# Patient Record
Sex: Male | Born: 1945 | Race: White | Hispanic: No | Marital: Married | Smoking: Never smoker
Health system: Southern US, Community
[De-identification: ages and names within clinical notes are randomized; demographics above are authoritative.]

## PROBLEM LIST (undated history)

## (undated) DIAGNOSIS — I10 Essential (primary) hypertension: Secondary | ICD-10-CM

## (undated) DIAGNOSIS — K219 Gastro-esophageal reflux disease without esophagitis: Secondary | ICD-10-CM

## (undated) DIAGNOSIS — E785 Hyperlipidemia, unspecified: Secondary | ICD-10-CM

## (undated) DIAGNOSIS — C4490 Unspecified malignant neoplasm of skin, unspecified: Secondary | ICD-10-CM

## (undated) DIAGNOSIS — E039 Hypothyroidism, unspecified: Secondary | ICD-10-CM

## (undated) DIAGNOSIS — I480 Paroxysmal atrial fibrillation: Secondary | ICD-10-CM

## (undated) DIAGNOSIS — Q2112 Patent foramen ovale: Secondary | ICD-10-CM

## (undated) DIAGNOSIS — N529 Male erectile dysfunction, unspecified: Secondary | ICD-10-CM

## (undated) DIAGNOSIS — I639 Cerebral infarction, unspecified: Secondary | ICD-10-CM

## (undated) HISTORY — DX: Patent foramen ovale: Q21.12

## (undated) HISTORY — DX: Hyperlipidemia, unspecified: E78.5

## (undated) HISTORY — PX: HERNIA REPAIR: SHX51

## (undated) HISTORY — DX: Male erectile dysfunction, unspecified: N52.9

## (undated) HISTORY — DX: Gastro-esophageal reflux disease without esophagitis: K21.9

## (undated) HISTORY — PX: VASECTOMY: SHX75

## (undated) HISTORY — DX: Hypothyroidism, unspecified: E03.9

## (undated) HISTORY — DX: Essential (primary) hypertension: I10

## (undated) HISTORY — DX: Paroxysmal atrial fibrillation: I48.0

## (undated) HISTORY — PX: POLYPECTOMY: SHX149

## (undated) HISTORY — DX: Cerebral infarction, unspecified: I63.9

## (undated) HISTORY — DX: Unspecified malignant neoplasm of skin, unspecified: C44.90

---

## 2004-07-02 ENCOUNTER — Ambulatory Visit: Payer: Self-pay | Admitting: Family Medicine

## 2005-03-25 ENCOUNTER — Ambulatory Visit: Payer: Self-pay | Admitting: Family Medicine

## 2005-06-26 ENCOUNTER — Ambulatory Visit: Payer: Self-pay | Admitting: Family Medicine

## 2005-07-03 ENCOUNTER — Ambulatory Visit: Payer: Self-pay | Admitting: Family Medicine

## 2005-07-16 ENCOUNTER — Ambulatory Visit: Payer: Self-pay | Admitting: Gastroenterology

## 2005-08-27 ENCOUNTER — Encounter (INDEPENDENT_AMBULATORY_CARE_PROVIDER_SITE_OTHER): Payer: Self-pay | Admitting: Specialist

## 2005-08-27 ENCOUNTER — Ambulatory Visit: Payer: Self-pay | Admitting: Gastroenterology

## 2005-09-27 ENCOUNTER — Ambulatory Visit: Payer: Self-pay | Admitting: Family Medicine

## 2006-02-11 LAB — HM COLONOSCOPY: HM Colonoscopy: NORMAL

## 2006-06-24 ENCOUNTER — Ambulatory Visit: Payer: Self-pay | Admitting: Family Medicine

## 2006-06-24 LAB — CONVERTED CEMR LAB
ALT: 24 units/L (ref 0–40)
AST: 21 units/L (ref 0–37)
Albumin: 4.1 g/dL (ref 3.5–5.2)
BUN: 9 mg/dL (ref 6–23)
Basophils Absolute: 0 10*3/uL (ref 0.0–0.1)
Bilirubin, Direct: 0.1 mg/dL (ref 0.0–0.3)
Calcium: 9.2 mg/dL (ref 8.4–10.5)
Chloride: 105 meq/L (ref 96–112)
Eosinophils Absolute: 0.2 10*3/uL (ref 0.0–0.6)
Eosinophils Relative: 4.2 % (ref 0.0–5.0)
GFR calc Af Amer: 111 mL/min
GFR calc non Af Amer: 91 mL/min
Glucose, Bld: 87 mg/dL (ref 70–99)
HDL: 43.4 mg/dL (ref >39.0)
Lymphocytes Relative: 26.4 % (ref 12.0–46.0)
MCHC: 34.7 g/dL (ref 30.0–36.0)
MCV: 86.2 fL (ref 78.0–100.0)
Monocytes Relative: 9.1 % (ref 3.0–11.0)
Neutro Abs: 2.7 10*3/uL (ref 1.4–7.7)
Platelets: 136 10*3/uL — ABNORMAL LOW (ref 150–400)
RBC: 5.43 M/uL (ref 4.22–5.81)
Total CHOL/HDL Ratio: 3
Triglycerides: 69 mg/dL (ref 0–149)
WBC: 4.5 10*3/uL (ref 4.5–10.5)

## 2006-07-01 ENCOUNTER — Ambulatory Visit: Payer: Self-pay | Admitting: Family Medicine

## 2006-09-30 DIAGNOSIS — I10 Essential (primary) hypertension: Secondary | ICD-10-CM | POA: Insufficient documentation

## 2006-09-30 DIAGNOSIS — E782 Mixed hyperlipidemia: Secondary | ICD-10-CM | POA: Insufficient documentation

## 2006-09-30 DIAGNOSIS — E039 Hypothyroidism, unspecified: Secondary | ICD-10-CM | POA: Insufficient documentation

## 2006-09-30 DIAGNOSIS — K219 Gastro-esophageal reflux disease without esophagitis: Secondary | ICD-10-CM | POA: Insufficient documentation

## 2007-07-15 ENCOUNTER — Ambulatory Visit: Payer: Self-pay | Admitting: Family Medicine

## 2007-07-15 LAB — CONVERTED CEMR LAB
Bilirubin Urine: NEGATIVE
Glucose, Urine, Semiquant: NEGATIVE
Ketones, urine, test strip: NEGATIVE
Urobilinogen, UA: 0.2
pH: 7

## 2007-07-16 LAB — CONVERTED CEMR LAB
Basophils Absolute: 0 10*3/uL (ref 0.0–0.1)
Basophils Relative: 0.1 % (ref 0.0–1.0)
Calcium: 8.2 mg/dL — ABNORMAL LOW (ref 8.4–10.5)
Cholesterol: 135 mg/dL (ref 0–200)
Creatinine, Ser: 1.1 mg/dL (ref 0.4–1.5)
Eosinophils Absolute: 0.2 10*3/uL (ref 0.0–0.7)
GFR calc Af Amer: 88 mL/min
GFR calc non Af Amer: 72 mL/min
Hemoglobin: 15.6 g/dL (ref 13.0–17.0)
MCHC: 35.3 g/dL (ref 30.0–36.0)
MCV: 88.8 fL (ref 78.0–100.0)
Neutro Abs: 3.5 10*3/uL (ref 1.4–7.7)
PSA: 0.38 ng/mL (ref 0.10–4.00)
RBC: 5 M/uL (ref 4.22–5.81)
RDW: 13.1 % (ref 11.5–14.6)
Sodium: 135 meq/L (ref 135–145)
TSH: 3.39 microintl units/mL (ref 0.35–5.50)
Total Bilirubin: 1 mg/dL (ref 0.3–1.2)
Triglycerides: 77 mg/dL (ref 0–149)
VLDL: 15 mg/dL (ref 0–40)

## 2007-07-22 ENCOUNTER — Ambulatory Visit: Payer: Self-pay | Admitting: Family Medicine

## 2007-07-22 DIAGNOSIS — N529 Male erectile dysfunction, unspecified: Secondary | ICD-10-CM | POA: Insufficient documentation

## 2007-07-22 DIAGNOSIS — F528 Other sexual dysfunction not due to a substance or known physiological condition: Secondary | ICD-10-CM | POA: Insufficient documentation

## 2007-08-21 ENCOUNTER — Telehealth (INDEPENDENT_AMBULATORY_CARE_PROVIDER_SITE_OTHER): Payer: Self-pay | Admitting: *Deleted

## 2008-02-23 ENCOUNTER — Telehealth: Payer: Self-pay | Admitting: Family Medicine

## 2008-08-01 ENCOUNTER — Ambulatory Visit: Payer: Self-pay | Admitting: Family Medicine

## 2008-08-01 LAB — CONVERTED CEMR LAB
Bilirubin Urine: NEGATIVE
Glucose, Urine, Semiquant: NEGATIVE
Specific Gravity, Urine: 1.02
Urobilinogen, UA: 0.2
pH: 6.5

## 2008-08-04 LAB — CONVERTED CEMR LAB
ALT: 14 units/L (ref 0–53)
BUN: 13 mg/dL (ref 6–23)
Basophils Relative: 0 % (ref 0.0–3.0)
CO2: 29 meq/L (ref 19–32)
Chloride: 105 meq/L (ref 96–112)
Cholesterol: 169 mg/dL (ref 0–200)
Eosinophils Relative: 3 % (ref 0.0–5.0)
HCT: 47.6 % (ref 39.0–52.0)
LDL Cholesterol: 110 mg/dL — ABNORMAL HIGH (ref 0–99)
Lymphs Abs: 1 10*3/uL (ref 0.7–4.0)
MCV: 89.9 fL (ref 78.0–100.0)
Monocytes Absolute: 0.4 10*3/uL (ref 0.1–1.0)
PSA: 0.34 ng/mL (ref 0.10–4.00)
Platelets: 114 10*3/uL — ABNORMAL LOW (ref 150.0–400.0)
Potassium: 4.1 meq/L (ref 3.5–5.1)
RBC: 5.3 M/uL (ref 4.22–5.81)
TSH: 4.4 microintl units/mL (ref 0.35–5.50)
Total Protein: 7.1 g/dL (ref 6.0–8.3)
WBC: 4.5 10*3/uL (ref 4.5–10.5)

## 2008-08-09 ENCOUNTER — Ambulatory Visit: Payer: Self-pay | Admitting: Family Medicine

## 2009-01-27 ENCOUNTER — Telehealth: Payer: Self-pay | Admitting: Family Medicine

## 2009-04-24 ENCOUNTER — Encounter: Payer: Self-pay | Admitting: Family Medicine

## 2009-06-08 ENCOUNTER — Telehealth: Payer: Self-pay | Admitting: Family Medicine

## 2009-08-04 ENCOUNTER — Ambulatory Visit: Payer: Self-pay | Admitting: Family Medicine

## 2009-08-04 LAB — CONVERTED CEMR LAB
Nitrite: NEGATIVE
Specific Gravity, Urine: 1.02
WBC Urine, dipstick: NEGATIVE

## 2009-08-07 LAB — CONVERTED CEMR LAB
ALT: 19 units/L (ref 0–53)
Alkaline Phosphatase: 56 units/L (ref 39–117)
Basophils Relative: 0.3 % (ref 0.0–3.0)
CO2: 29 meq/L (ref 19–32)
Calcium: 9 mg/dL (ref 8.4–10.5)
Chloride: 108 meq/L (ref 96–112)
Eosinophils Absolute: 0.2 10*3/uL (ref 0.0–0.7)
Eosinophils Relative: 3.6 % (ref 0.0–5.0)
Hemoglobin: 15.9 g/dL (ref 13.0–17.0)
Lymphocytes Relative: 26 % (ref 12.0–46.0)
MCHC: 34.6 g/dL (ref 30.0–36.0)
MCV: 90.1 fL (ref 78.0–100.0)
Neutro Abs: 3 10*3/uL (ref 1.4–7.7)
PSA: 0.39 ng/mL (ref 0.10–4.00)
RBC: 5.1 M/uL (ref 4.22–5.81)
Sodium: 142 meq/L (ref 135–145)
TSH: 8.22 microintl units/mL — ABNORMAL HIGH (ref 0.35–5.50)
Total CHOL/HDL Ratio: 3
Total Protein: 7.2 g/dL (ref 6.0–8.3)

## 2009-08-11 ENCOUNTER — Ambulatory Visit: Payer: Self-pay | Admitting: Family Medicine

## 2010-01-23 ENCOUNTER — Telehealth: Payer: Self-pay | Admitting: *Deleted

## 2010-03-09 ENCOUNTER — Ambulatory Visit
Admission: RE | Admit: 2010-03-09 | Discharge: 2010-03-09 | Payer: Self-pay | Source: Home / Self Care | Attending: Family Medicine | Admitting: Family Medicine

## 2010-03-09 ENCOUNTER — Other Ambulatory Visit: Payer: Self-pay | Admitting: Family Medicine

## 2010-03-09 ENCOUNTER — Encounter: Payer: Self-pay | Admitting: Family Medicine

## 2010-03-09 LAB — TSH: TSH: 2.57 u[IU]/mL (ref 0.35–5.50)

## 2010-03-11 LAB — CONVERTED CEMR LAB: Testosterone: 348.58 ng/dL — ABNORMAL LOW (ref 350.00–890)

## 2010-03-13 ENCOUNTER — Encounter: Payer: Self-pay | Admitting: Family Medicine

## 2010-03-15 NOTE — Assessment & Plan Note (Signed)
Summary: bp concerns/cb   Vital Signs:  Patient profile:   65 year old male Weight:      181 pounds O2 Sat:      95 % Temp:     98.5 degrees F Pulse rate:   67 / minute BP sitting:   150 / 88  (left arm) Cuff size:   regular  Vitals Entered By: Pura Spice, RN (March 09, 2010 12:02 PM) CC: c/o BP issues states dreams alot eating late pizza wine etc  states when wakes up feel like "palpitations"    CC:  c/o BP issues states dreams alot eating late pizza wine etc  states when wakes up feel like "palpitations" .  History of Present Illness: Here to discuss his BP. Several months ago his pharmacist switched from one generic form of benazepril to another, and almost immediately he started feeling intermittent pounding in his chest as if his heart was beating harder. This did not seem to affect his pulse rate at all. At the same time he noticed his BPs going up from his typical 12os -130s over 80s to 140s -160s over 90s. No chest pains or SOB. Then a few weeks ago the pharmacy switched back to the original generic , and his BP settled back down.   Allergies: No Known Drug Allergies  Past History:  Past Medical History: Reviewed history from 08/09/2008 and no changes required. GERD ED Hyperlipidemia Hypertension Hypothyroidism low testosterone  Review of Systems  The patient denies anorexia, fever, weight loss, weight gain, vision loss, decreased hearing, hoarseness, chest pain, syncope, dyspnea on exertion, peripheral edema, prolonged cough, headaches, hemoptysis, abdominal pain, melena, hematochezia, severe indigestion/heartburn, hematuria, incontinence, genital sores, muscle weakness, suspicious skin lesions, transient blindness, difficulty walking, depression, unusual weight change, abnormal bleeding, enlarged lymph nodes, angioedema, breast masses, and testicular masses.    Physical Exam  General:  Well-developed,well-nourished,in no acute distress; alert,appropriate and  cooperative throughout examination Neck:  No deformities, masses, or tenderness noted. Lungs:  Normal respiratory effort, chest expands symmetrically. Lungs are clear to auscultation, no crackles or wheezes. Heart:  Normal rate and regular rhythm. S1 and S2 normal without gallop, murmur, click, rub or other extra sounds. EKG normal    Impression & Recommendations:  Problem # 1:  HYPOTHYROIDISM (ICD-244.9)  His updated medication list for this problem includes:    Synthroid 100 Mcg Tabs (Levothyroxine sodium) .Marland Kitchen... 1 by mouth once daily  Orders: Venipuncture (46962) TLB-TSH (Thyroid Stimulating Hormone) (84443-TSH) Specimen Handling (95284)  Problem # 2:  HYPERTENSION (ICD-401.9)  The following medications were removed from the medication list:    Benazepril Hcl 20 Mg Tabs (Benazepril hcl) ..... One daily His updated medication list for this problem includes:    Metoprolol Succinate 50 Mg Xr24h-tab (Metoprolol succinate) ..... Once daily  Complete Medication List: 1)  Zocor 40 Mg Tabs (Simvastatin) .Marland Kitchen.. 1 by mouth once daily 2)  Synthroid 100 Mcg Tabs (Levothyroxine sodium) .Marland Kitchen.. 1 by mouth once daily 3)  Omeprazole 20 Mg Cpdr (Omeprazole) .... One by mouth daily 4)  Levitra 20 Mg Tabs (Vardenafil hcl) .... As needed 5)  Metoprolol Succinate 50 Mg Xr24h-tab (Metoprolol succinate) .... Once daily  Patient Instructions: 1)  switch to metoprolol. Check a TSH today.  2)  Please schedule a follow-up appointment in 1 month.  Prescriptions: METOPROLOL SUCCINATE 50 MG XR24H-TAB (METOPROLOL SUCCINATE) once daily  #30 x 2   Entered and Authorized by:   Nelwyn Salisbury MD   Signed by:  Nelwyn Salisbury MD on 03/09/2010   Method used:   Electronically to        [facility]* (retail)       [address]       [place], [place]  [address]       Ph: [phone]       Fax: [phone]   RxID:   [id number]    Orders Added: 1)  Venipuncture [84132] 2)  TLB-TSH (Thyroid Stimulating  Hormone) [84443-TSH] 3)  Est. Patient Level IV [44010] 4)  Specimen Handling [99000]

## 2010-03-15 NOTE — Progress Notes (Signed)
Summary: change Rx  Phone Note From Pharmacy   Caller: [facility]* Call For: Dr Persia Lintner  Summary of Call: Omeprazole 20mg /d - can we switch to generic protonix now that it is not cost prohibitive? need new Rx Initial call taken by: Raechel Ache, RN,  June 08, 2009 9:23 AM

## 2010-03-15 NOTE — Progress Notes (Signed)
Summary: refill  Phone Note From Pharmacy   Caller: [facility]* Reason for Call: Needs renewal Details for Reason: benazepril Initial call taken by: Romualdo Bolk, CMA Duncan Dull),  January 23, 2010 4:38 PM  Follow-up for Phone Call        rx sent to pharmacy Follow-up by: Romualdo Bolk, CMA Duncan Dull),  January 23, 2010 4:38 PM    Prescriptions: BENAZEPRIL HCL 20 MG TABS (BENAZEPRIL HCL) One daily  #30 x 2   Entered by:   Romualdo Bolk, CMA (AAMA)   Authorized by:   Nelwyn Salisbury MD   Signed by:   Romualdo Bolk, CMA (AAMA) on 01/23/2010   Method used:   Electronically to        [facility]* (retail)       [address]       [place], [place]  [address]       Ph: [phone]       Fax: [phone]   RxID:   [id number]

## 2010-03-15 NOTE — Medication Information (Signed)
Summary: Coverage Approval for Levitra  Coverage Approval for Levitra   Imported By: Maryln Gottron 04/27/2009 10:14:07  _____________________________________________________________________  External Attachment:    Type:   Image     Comment:   External Document

## 2010-03-15 NOTE — Assessment & Plan Note (Signed)
Summary: CPX // RS   Vital Signs:  Patient profile:   64 year old male Weight:      180 pounds BMI:     23.19 BP sitting:   150 / 80  (left arm) Cuff size:   regular  Vitals Entered By: Raechel Ache, RN (August 11, 2009 9:17 AM) CC: CPX, labs done. C/o R knee and LBP.   History of Present Illness: 65 yr old male for a cpx. He feels fine and has no concerns.   Allergies (verified): No Known Drug Allergies  Past History:  Past Medical History: Reviewed history from 08/09/2008 and no changes required. GERD ED Hyperlipidemia Hypertension Hypothyroidism low testosterone  Past Surgical History: Reviewed history from 07/22/2007 and no changes required. Colonoscopy 08-27-05 per Dr. Arlyce Dice, repeat in 5 yrs Inguinal herniorrhaphies, bilateral Vasectomy  Family History: Reviewed history from 09/30/2006 and no changes required. Family History Other cancer - Abdominal Family History of Cardiovascular disorder  Social History: Reviewed history from 09/30/2006 and no changes required. Occupation: Married Current Smoker Alcohol use-yes  Review of Systems  The patient denies anorexia, fever, weight loss, weight gain, vision loss, decreased hearing, hoarseness, chest pain, syncope, dyspnea on exertion, peripheral edema, prolonged cough, headaches, hemoptysis, abdominal pain, melena, hematochezia, severe indigestion/heartburn, hematuria, incontinence, genital sores, muscle weakness, suspicious skin lesions, transient blindness, difficulty walking, depression, unusual weight change, abnormal bleeding, enlarged lymph nodes, angioedema, breast masses, and testicular masses.    Physical Exam  General:  Well-developed,well-nourished,in no acute distress; alert,appropriate and cooperative throughout examination Head:  Normocephalic and atraumatic without obvious abnormalities. No apparent alopecia or balding. Eyes:  No corneal or conjunctival inflammation noted. EOMI. Perrla.  Funduscopic exam benign, without hemorrhages, exudates or papilledema. Vision grossly normal. Ears:  External ear exam shows no significant lesions or deformities.  Otoscopic examination reveals clear canals, tympanic membranes are intact bilaterally without bulging, retraction, inflammation or discharge. Hearing is grossly normal bilaterally. Nose:  External nasal examination shows no deformity or inflammation. Nasal mucosa are pink and moist without lesions or exudates. Mouth:  Oral mucosa and oropharynx without lesions or exudates.  Teeth in good repair. Neck:  No deformities, masses, or tenderness noted. Chest Wall:  No deformities, masses, tenderness or gynecomastia noted. Lungs:  Normal respiratory effort, chest expands symmetrically. Lungs are clear to auscultation, no crackles or wheezes. Heart:  Normal rate and regular rhythm. S1 and S2 normal without gallop, murmur, click, rub or other extra sounds. EKG normal Abdomen:  Bowel sounds positive,abdomen soft and non-tender without masses, organomegaly or hernias noted. Rectal:  No external abnormalities noted. Normal sphincter tone. No rectal masses or tenderness. Heme neg Genitalia:  Testes bilaterally descended without nodularity, tenderness or masses. No scrotal masses or lesions. No penis lesions or urethral discharge. Prostate:  Prostate gland firm and smooth, no enlargement, nodularity, tenderness, mass, asymmetry or induration. Msk:  No deformity or scoliosis noted of thoracic or lumbar spine.   Pulses:  R and L carotid,radial,femoral,dorsalis pedis and posterior tibial pulses are full and equal bilaterally Extremities:  No clubbing, cyanosis, edema, or deformity noted with normal full range of motion of all joints.   Neurologic:  No cranial nerve deficits noted. Station and gait are normal. Plantar reflexes are down-going bilaterally. DTRs are symmetrical throughout. Sensory, motor and coordinative functions appear intact. Skin:  Intact  without suspicious lesions or rashes Cervical Nodes:  No lymphadenopathy noted Axillary Nodes:  No palpable lymphadenopathy Inguinal Nodes:  No significant adenopathy Psych:  Cognition and  judgment appear intact. Alert and cooperative with normal attention span and concentration. No apparent delusions, illusions, hallucinations   Impression & Recommendations:  Problem # 1:  PHYSICAL EXAMINATION (ICD-V70.0)  Orders: Hemoccult Guaiac-1 spec.(in office) (82270) EKG w/ Interpretation (93000)  Complete Medication List: 1)  Benazepril Hcl 20 Mg Tabs (Benazepril hcl) .... One daily 2)  Zocor 40 Mg Tabs (Simvastatin) .Marland Kitchen.. 1 by mouth once daily 3)  Synthroid 100 Mcg Tabs (Levothyroxine sodium) .Marland Kitchen.. 1 by mouth once daily 4)  Omeprazole 20 Mg Cpdr (Omeprazole) .... One by mouth daily 5)  Levitra 20 Mg Tabs (Vardenafil hcl) .... As needed  Patient Instructions: 1)  Please schedule a follow-up appointment in 6 months .  Prescriptions: LEVITRA 20 MG TABS (VARDENAFIL HCL) as needed  #10 x 11   Entered and Authorized by:   Nelwyn Salisbury MD   Signed by:   Nelwyn Salisbury MD on 08/11/2009   Method used:   Print then Give to Patient   RxID:   [id number] SYNTHROID 100 MCG  TABS (LEVOTHYROXINE SODIUM) 1 by mouth once daily  #30 x 11   Entered and Authorized by:   Nelwyn Salisbury MD   Signed by:   Nelwyn Salisbury MD on 08/11/2009   Method used:   Electronically to        [facility]* (retail)       [address]       [place], [place]  [address]       Ph: [phone]       Fax: [phone]   RxID:   [id number] ZOCOR 40 MG  TABS (SIMVASTATIN) 1 by mouth once daily  #30 x 11   Entered and Authorized by:   Nelwyn Salisbury MD   Signed by:   Nelwyn Salisbury MD on 08/11/2009   Method used:   Electronically to        [facility]* (retail)       [address]       [place], [place]  [address]       Ph: [phone]       Fax: [phone]   RxID:   [id number]

## 2010-03-19 ENCOUNTER — Other Ambulatory Visit: Payer: Self-pay | Admitting: Family Medicine

## 2010-03-19 DIAGNOSIS — K219 Gastro-esophageal reflux disease without esophagitis: Secondary | ICD-10-CM

## 2010-04-09 ENCOUNTER — Encounter: Payer: Self-pay | Admitting: Family Medicine

## 2010-04-09 ENCOUNTER — Ambulatory Visit (INDEPENDENT_AMBULATORY_CARE_PROVIDER_SITE_OTHER): Payer: Self-pay | Admitting: Family Medicine

## 2010-04-09 VITALS — BP 136/62 | HR 85 | Temp 98.4°F | Resp 12 | Wt 183.0 lb

## 2010-04-09 DIAGNOSIS — I10 Essential (primary) hypertension: Secondary | ICD-10-CM

## 2010-04-09 MED ORDER — METOPROLOL SUCCINATE ER 50 MG PO TB24: 50.0000 mg | ORAL_TABLET | Freq: Every day | ORAL | Status: DC

## 2010-04-09 NOTE — Progress Notes (Signed)
  Subjective:    Patient ID: Sean Mcgee, male    DOB: 12/02/1945, 65 y.o.   MRN: [id number]  HPI Here to follow up on HTN. He has been taking metoprolol the past month, and he feels fine. His BP at home is quite steady.    Review of Systems  Constitutional: Negative.   Respiratory: Negative.   Cardiovascular: Negative.        Objective:   Physical Exam  Constitutional: He appears well-developed and well-nourished.  Cardiovascular: Normal rate, regular rhythm, normal heart sounds and intact distal pulses.  Exam reveals no gallop and no friction rub.   No murmur heard. Pulmonary/Chest: Effort normal and breath sounds normal. No respiratory distress. He has no wheezes. He has no rales. He exhibits no tenderness.          Assessment & Plan:  Doing well. We will stay on the current regimen

## 2010-06-26 NOTE — Assessment & Plan Note (Signed)
[facility] OFFICE NOTE   Sean Mcgee, Sean Mcgee                             MRN:          [id number]  DATE:07/01/2006                            DOB:          1945-10-14    This is a 65 year old gentleman here for a complete physical  examination.  He is doing quite well and has no complaints at all.  We  have been treating him for GE reflux disease for a while.  He had been  taking Protonix but then realized he did just as well with over-the-  counter Prilosec; therefore, he has switched.  He had a colonoscopy in  July, 2007 which was unremarkable.  A 10-year followup was recommended  at that time.  Otherwise, he continues to have trouble with erections.  He has tried Orthoptist and Viagra with mixed results.  He is interested in  trying Cialis now.  Further details of his past medical history, family  history, social history, habits, etc., refer to our last physical note  dated Jul 03, 2005.   ALLERGIES:  None.   CURRENT MEDICATIONS:  1. Benazepril 10 mg per day.  2. Simvastatin 40 mg per day.  3. Synthroid 100 mcg per day.  4. Prilosec over the counter daily as needed.   OBJECTIVE:  VITAL SIGNS:  Height 6 feet 2 inches.  Weight 183.  BP  128/64.  Pulse 80 and regular.  GENERAL:  He appears to be quite fit.  SKIN:  Clear.  HEENT:  Eyes are clear.  He wears glasses.  His ears are clear.  Pharynx  is clear.  NECK:  Supple without lymphadenopathy or masses.  LUNGS:  Clear.  HEART:  Regular rate and rhythm without murmurs, rubs, gallops, or  lifts.  Pulses are full.  EKG is within normal limits.  ABDOMEN:  Soft.  Normal bowel sounds.  Nontender.  No masses.  GENITALIA:  Normal male.  He is circumcised.  RECTAL:  No masses or tenderness.  Prostate is within normal limits.  Stool is hemoccult negative.  EXTREMITIES:  No clubbing, cyanosis or edema.  NEUROLOGIC:  Grossly intact.   He is here for fasting labs on May  13th.  All of these were within  normal limits, including an HDL of 43, an LDL of 75, and a TSH of 2.   ASSESSMENT/PLAN:  1. A complete physical:  Will plan on doing this again in a year.  2. Hypertension:  Stable.  3. Gastroesophageal reflux disease:  Stable.  4. Hyperlipidemia:  Stable.  5. Erectile dysfunction:  I gave him samples to try Cialis 20 mg as      needed.  6. Hypothyroidism:  Stable.     Tera Mater. Clent Ridges, MD  Electronically Signed    SAF/MedQ  DD: 07/01/2006  DT: 07/01/2006  Job #: [id number]

## 2010-08-10 ENCOUNTER — Other Ambulatory Visit (INDEPENDENT_AMBULATORY_CARE_PROVIDER_SITE_OTHER): Payer: Managed Care, Other (non HMO)

## 2010-08-10 DIAGNOSIS — Z0000 Encounter for general adult medical examination without abnormal findings: Secondary | ICD-10-CM

## 2010-08-10 LAB — POCT URINALYSIS DIPSTICK
Ketones, UA: NEGATIVE
Leukocytes, UA: NEGATIVE
Nitrite, UA: NEGATIVE
pH, UA: 6.5

## 2010-08-10 LAB — CBC WITH DIFFERENTIAL/PLATELET
Basophils Absolute: 0 10*3/uL (ref 0.0–0.1)
Eosinophils Absolute: 0.3 10*3/uL (ref 0.0–0.7)
Lymphocytes Relative: 23.5 % (ref 12.0–46.0)
MCHC: 33.9 g/dL (ref 30.0–36.0)
Neutrophils Relative %: 63.2 % (ref 43.0–77.0)
RBC: 5.02 Mil/uL (ref 4.22–5.81)
RDW: 14.4 % (ref 11.5–14.6)

## 2010-08-10 LAB — BASIC METABOLIC PANEL
CO2: 28 mEq/L (ref 19–32)
Calcium: 9 mg/dL (ref 8.4–10.5)
Creatinine, Ser: 1 mg/dL (ref 0.4–1.5)
Glucose, Bld: 89 mg/dL (ref 70–99)

## 2010-08-10 LAB — LIPID PANEL
HDL: 48.1 mg/dL (ref >39.00)
Total CHOL/HDL Ratio: 3
VLDL: 10.2 mg/dL (ref 0.0–40.0)

## 2010-08-10 LAB — HEPATIC FUNCTION PANEL
Alkaline Phosphatase: 59 U/L (ref 39–117)
Bilirubin, Direct: 0.2 mg/dL (ref 0.0–0.3)
Total Bilirubin: 1.2 mg/dL (ref 0.3–1.2)

## 2010-08-16 ENCOUNTER — Encounter: Payer: Self-pay | Admitting: Family Medicine

## 2010-08-17 ENCOUNTER — Ambulatory Visit (INDEPENDENT_AMBULATORY_CARE_PROVIDER_SITE_OTHER): Payer: Managed Care, Other (non HMO) | Admitting: Family Medicine

## 2010-08-17 ENCOUNTER — Encounter: Payer: Self-pay | Admitting: Family Medicine

## 2010-08-17 VITALS — BP 128/70 | HR 62 | Temp 97.7°F | Ht 73.5 in | Wt 184.0 lb

## 2010-08-17 DIAGNOSIS — Z0000 Encounter for general adult medical examination without abnormal findings: Secondary | ICD-10-CM

## 2010-08-17 MED ORDER — VARDENAFIL HCL 20 MG PO TABS: 20.0000 mg | ORAL_TABLET | Freq: Every day | ORAL | Status: DC | PRN

## 2010-08-17 MED ORDER — LEVOTHYROXINE SODIUM 100 MCG PO TABS: 100.0000 ug | ORAL_TABLET | Freq: Every day | ORAL | Status: DC

## 2010-08-17 MED ORDER — SIMVASTATIN 40 MG PO TABS: 40.0000 mg | ORAL_TABLET | Freq: Every day | ORAL | Status: DC

## 2010-08-17 NOTE — Progress Notes (Signed)
  Subjective:    Patient ID: Sean Mcgee, male    DOB: 22-Sep-1945, 65 y.o.   MRN: [id number]  HPI 65 yr old male for a cpx. He feels fine and has no complaints.    Review of Systems  Constitutional: Negative.   HENT: Negative.   Eyes: Negative.   Respiratory: Negative.   Cardiovascular: Negative.   Gastrointestinal: Negative.   Genitourinary: Negative.   Musculoskeletal: Negative.   Skin: Negative.   Neurological: Negative.   Hematological: Negative.   Psychiatric/Behavioral: Negative.        Objective:   Physical Exam  Constitutional: He is oriented to person, place, and time. He appears well-developed and well-nourished. No distress.  HENT:  Head: Normocephalic and atraumatic.  Right Ear: External ear normal.  Left Ear: External ear normal.  Nose: Nose normal.  Mouth/Throat: Oropharynx is clear and moist. No oropharyngeal exudate.  Eyes: Conjunctivae and EOM are normal. Pupils are equal, round, and reactive to light. Right eye exhibits no discharge. Left eye exhibits no discharge. No scleral icterus.  Neck: Neck supple. No JVD present. No tracheal deviation present. No thyromegaly present.  Cardiovascular: Normal rate, regular rhythm, normal heart sounds and intact distal pulses.  Exam reveals no gallop and no friction rub.   No murmur heard.      EKG normal with sinus bradycardia   Pulmonary/Chest: Effort normal and breath sounds normal. No respiratory distress. He has no wheezes. He has no rales. He exhibits no tenderness.  Abdominal: Soft. Bowel sounds are normal. He exhibits no distension and no mass. There is no tenderness. There is no rebound and no guarding.  Genitourinary: Rectum normal, prostate normal and penis normal. Guaiac negative stool. No penile tenderness.  Musculoskeletal: Normal range of motion. He exhibits no edema and no tenderness.  Lymphadenopathy:    He has no cervical adenopathy.  Neurological: He is alert and oriented to person, place, and time. He  has normal reflexes. No cranial nerve deficit. He exhibits normal muscle tone. Coordination normal.  Skin: Skin is warm and dry. No rash noted. He is not diaphoretic. No erythema. No pallor.  Psychiatric: He has a normal mood and affect. His behavior is normal. Judgment and thought content normal.          Assessment & Plan:  Refilled meds.

## 2010-08-28 ENCOUNTER — Other Ambulatory Visit: Payer: Self-pay

## 2010-08-28 MED ORDER — VARDENAFIL HCL 20 MG PO TABS: 20.0000 mg | ORAL_TABLET | Freq: Every day | ORAL | Status: DC | PRN

## 2010-08-29 ENCOUNTER — Telehealth: Payer: Self-pay

## 2010-08-30 NOTE — Telephone Encounter (Signed)
Rx sent to [facility]. Pt went to [facility]. Rx for lev[facility]ra ph[facility] into [facility]

## 2010-11-20 ENCOUNTER — Telehealth: Payer: Self-pay | Admitting: Family Medicine

## 2010-11-20 NOTE — Telephone Encounter (Signed)
Pt is requesting a increase for Prilosec to 40 mg, he said that the 20 mg is not working. Oncologist is [employer] )

## 2010-11-20 NOTE — Telephone Encounter (Signed)
Call in 40 mg qd, #30 with 11 rf

## 2010-11-21 ENCOUNTER — Telehealth: Payer: Self-pay | Admitting: Family Medicine

## 2010-11-21 DIAGNOSIS — K219 Gastro-esophageal reflux disease without esophagitis: Secondary | ICD-10-CM

## 2010-11-21 MED ORDER — OMEPRAZOLE 20 MG PO CPDR: 20.0000 mg | DELAYED_RELEASE_CAPSULE | Freq: Every day | ORAL | Status: DC

## 2010-11-21 NOTE — Telephone Encounter (Signed)
Script sent e-scribe 

## 2010-11-22 MED ORDER — OMEPRAZOLE 40 MG PO CPDR: 40.0000 mg | DELAYED_RELEASE_CAPSULE | Freq: Every day | ORAL | Status: DC

## 2010-11-22 NOTE — Telephone Encounter (Signed)
rx sent in electronically 

## 2010-11-27 ENCOUNTER — Telehealth: Payer: Self-pay | Admitting: Family Medicine

## 2010-11-27 NOTE — Telephone Encounter (Signed)
Pt is having issues with the prescription of omeprazole (PRILOSEC) 40 MG capsule The dose was raised and when he picked it up the pill is very large and he is having a hard time taking it. Pt is wanting to get it reduced back down to 20 mg. Please contact pt

## 2010-11-28 NOTE — Telephone Encounter (Signed)
Please change the rx to 20 mg to take 2 a day. Call in one year supply

## 2010-11-29 MED ORDER — OMEPRAZOLE 20 MG PO CPDR: 20.0000 mg | DELAYED_RELEASE_CAPSULE | Freq: Two times a day (BID) | ORAL | Status: DC

## 2010-11-29 NOTE — Telephone Encounter (Signed)
Script sent e-scribe and pt aware. 

## 2010-12-13 ENCOUNTER — Telehealth: Payer: Self-pay | Admitting: Family Medicine

## 2010-12-13 NOTE — Telephone Encounter (Signed)
On Omeprazole 40mg . Wants to go back on Protonix 20mg  or the eqilavent. Please send new rx to [facility]. Thanks.

## 2010-12-13 NOTE — Telephone Encounter (Signed)
Switch to Protonix 40 mg a day. Call in one year supply

## 2010-12-14 MED ORDER — PANTOPRAZOLE SODIUM 40 MG PO TBEC: 40.0000 mg | DELAYED_RELEASE_TABLET | Freq: Every day | ORAL | Status: DC

## 2011-04-19 ENCOUNTER — Other Ambulatory Visit: Payer: Self-pay | Admitting: Family Medicine

## 2011-07-16 ENCOUNTER — Telehealth: Payer: Self-pay | Admitting: Family Medicine

## 2011-07-16 DIAGNOSIS — Z0000 Encounter for general adult medical examination without abnormal findings: Secondary | ICD-10-CM

## 2011-07-16 NOTE — Telephone Encounter (Signed)
Pt is going to [facility] lab 6.24 for CPX labs. Can you please put the orders in? Thanks.

## 2011-07-16 NOTE — Telephone Encounter (Signed)
I did put future lab order in computer.

## 2011-08-02 ENCOUNTER — Encounter: Payer: Self-pay | Admitting: Family Medicine

## 2011-08-05 ENCOUNTER — Other Ambulatory Visit: Payer: Managed Care, Other (non HMO) | Admitting: Family Medicine

## 2011-08-05 ENCOUNTER — Ambulatory Visit (INDEPENDENT_AMBULATORY_CARE_PROVIDER_SITE_OTHER): Payer: Managed Care, Other (non HMO) | Admitting: Family Medicine

## 2011-08-05 DIAGNOSIS — Z0000 Encounter for general adult medical examination without abnormal findings: Secondary | ICD-10-CM

## 2011-08-05 LAB — BASIC METABOLIC PANEL
BUN: 12 mg/dL (ref 6–23)
Chloride: 107 mEq/L (ref 96–112)
GFR: 71.36 mL/min (ref >60.00)
Glucose, Bld: 95 mg/dL (ref 70–99)
Potassium: 4.2 mEq/L (ref 3.5–5.1)

## 2011-08-05 LAB — LIPID PANEL
Cholesterol: 150 mg/dL (ref 0–200)
VLDL: 17 mg/dL (ref 0.0–40.0)

## 2011-08-05 LAB — HEPATIC FUNCTION PANEL
AST: 18 U/L (ref 0–37)
Albumin: 3.7 g/dL (ref 3.5–5.2)

## 2011-08-05 LAB — CBC WITH DIFFERENTIAL/PLATELET
Basophils Absolute: 0 10*3/uL (ref 0.0–0.1)
Eosinophils Absolute: 0.2 10*3/uL (ref 0.0–0.7)
HCT: 46.6 % (ref 39.0–52.0)
Lymphs Abs: 1.5 10*3/uL (ref 0.7–4.0)
MCV: 89.5 fl (ref 78.0–100.0)
Monocytes Absolute: 0.4 10*3/uL (ref 0.1–1.0)
Platelets: 116 10*3/uL — ABNORMAL LOW (ref 150.0–400.0)
RDW: 13.9 % (ref 11.5–14.6)

## 2011-08-05 LAB — TSH: TSH: 6.33 u[IU]/mL — ABNORMAL HIGH (ref 0.35–5.50)

## 2011-08-06 MED ORDER — LEVOTHYROXINE SODIUM 150 MCG PO TABS: 150.0000 ug | ORAL_TABLET | Freq: Every day | ORAL | Status: DC

## 2011-08-06 NOTE — Progress Notes (Signed)
Quick Note:  I spoke with pt's wife and also sent script e-scribe. ______

## 2011-08-19 ENCOUNTER — Ambulatory Visit (INDEPENDENT_AMBULATORY_CARE_PROVIDER_SITE_OTHER): Payer: Managed Care, Other (non HMO) | Admitting: Family Medicine

## 2011-08-19 ENCOUNTER — Encounter: Payer: Self-pay | Admitting: Family Medicine

## 2011-08-19 VITALS — BP 124/80 | HR 65 | Temp 98.3°F | Ht 73.5 in | Wt 185.0 lb

## 2011-08-19 DIAGNOSIS — Z0000 Encounter for general adult medical examination without abnormal findings: Secondary | ICD-10-CM

## 2011-08-19 LAB — POCT URINALYSIS DIPSTICK
Spec Grav, UA: 1.025
Urobilinogen, UA: 1

## 2011-08-19 MED ORDER — OMEPRAZOLE 20 MG PO CPDR: 20.0000 mg | DELAYED_RELEASE_CAPSULE | Freq: Every day | ORAL | Status: DC

## 2011-08-19 NOTE — Progress Notes (Signed)
  Subjective:    Patient ID: Sean Mcgee, male    DOB: 19-Mar-1945, 66 y.o.   MRN: [id number]  HPI 66 yr old male for a cpx. He feels fine with no concerns. He watches his diet. His BP is stable. He has stopped using Levitra and says he no longer needs it.    Review of Systems  Constitutional: Negative.   HENT: Negative.   Eyes: Negative.   Respiratory: Negative.   Cardiovascular: Negative.   Gastrointestinal: Negative.   Genitourinary: Negative.   Musculoskeletal: Negative.   Skin: Negative.   Neurological: Negative.   Hematological: Negative.   Psychiatric/Behavioral: Negative.        Objective:   Physical Exam  Constitutional: He is oriented to person, place, and time. He appears well-developed and well-nourished. No distress.  HENT:  Head: Normocephalic and atraumatic.  Right Ear: External ear normal.  Left Ear: External ear normal.  Nose: Nose normal.  Mouth/Throat: Oropharynx is clear and moist. No oropharyngeal exudate.  Eyes: Conjunctivae and EOM are normal. Pupils are equal, round, and reactive to light. Right eye exhibits no discharge. Left eye exhibits no discharge. No scleral icterus.  Neck: Neck supple. No JVD present. No tracheal deviation present. No thyromegaly present.  Cardiovascular: Normal rate, regular rhythm, normal heart sounds and intact distal pulses.  Exam reveals no gallop and no friction rub.   No murmur heard.      EKG normal   Pulmonary/Chest: Effort normal and breath sounds normal. No respiratory distress. He has no wheezes. He has no rales. He exhibits no tenderness.  Abdominal: Soft. Bowel sounds are normal. He exhibits no distension and no mass. There is no tenderness. There is no rebound and no guarding.  Genitourinary: Rectum normal, prostate normal and penis normal. Guaiac negative stool. No penile tenderness.  Musculoskeletal: Normal range of motion. He exhibits no edema and no tenderness.  Lymphadenopathy:    He has no cervical adenopathy.    Neurological: He is alert and oriented to person, place, and time. He has normal reflexes. No cranial nerve deficit. He exhibits normal muscle tone. Coordination normal.  Skin: Skin is warm and dry. No rash noted. He is not diaphoretic. No erythema. No pallor.  Psychiatric: He has a normal mood and affect. His behavior is normal. Judgment and thought content normal.          Assessment & Plan:  Well exam.

## 2011-08-21 NOTE — Progress Notes (Signed)
Quick Note:  I spoke with pt ______ 

## 2011-10-03 ENCOUNTER — Other Ambulatory Visit: Payer: Self-pay | Admitting: Family Medicine

## 2011-11-15 ENCOUNTER — Other Ambulatory Visit: Payer: Self-pay | Admitting: Family Medicine

## 2012-01-22 ENCOUNTER — Other Ambulatory Visit: Payer: Self-pay | Admitting: Dermatology

## 2012-08-20 ENCOUNTER — Other Ambulatory Visit (INDEPENDENT_AMBULATORY_CARE_PROVIDER_SITE_OTHER): Payer: Medicare Other

## 2012-08-20 DIAGNOSIS — I10 Essential (primary) hypertension: Secondary | ICD-10-CM

## 2012-08-20 DIAGNOSIS — Z0000 Encounter for general adult medical examination without abnormal findings: Secondary | ICD-10-CM

## 2012-08-20 LAB — CBC WITH DIFFERENTIAL/PLATELET
Basophils Relative: 0.4 % (ref 0.0–3.0)
Eosinophils Relative: 4.2 % (ref 0.0–5.0)
Hemoglobin: 16 g/dL (ref 13.0–17.0)
Lymphocytes Relative: 26.3 % (ref 12.0–46.0)
MCHC: 34.1 g/dL (ref 30.0–36.0)
Monocytes Relative: 8 % (ref 3.0–12.0)
Neutro Abs: 3.5 10*3/uL (ref 1.4–7.7)
Neutrophils Relative %: 61.1 % (ref 43.0–77.0)
RBC: 5.22 Mil/uL (ref 4.22–5.81)
WBC: 5.8 10*3/uL (ref 4.5–10.5)

## 2012-08-20 LAB — HEPATIC FUNCTION PANEL
AST: 19 U/L (ref 0–37)
Albumin: 3.8 g/dL (ref 3.5–5.2)
Alkaline Phosphatase: 57 U/L (ref 39–117)
Total Protein: 6.6 g/dL (ref 6.0–8.3)

## 2012-08-20 LAB — LIPID PANEL
Cholesterol: 148 mg/dL (ref 0–200)
LDL Cholesterol: 80 mg/dL (ref 0–99)
Total CHOL/HDL Ratio: 3
VLDL: 19.2 mg/dL (ref 0.0–40.0)

## 2012-08-20 LAB — POCT URINALYSIS DIPSTICK
Glucose, UA: NEGATIVE
Nitrite, UA: NEGATIVE
Spec Grav, UA: 1.02
Urobilinogen, UA: 1

## 2012-08-20 LAB — BASIC METABOLIC PANEL
BUN: 14 mg/dL (ref 6–23)
CO2: 26 mEq/L (ref 19–32)
Chloride: 106 mEq/L (ref 96–112)
Creatinine, Ser: 1 mg/dL (ref 0.4–1.5)
Glucose, Bld: 85 mg/dL (ref 70–99)
Potassium: 4.2 mEq/L (ref 3.5–5.1)

## 2012-08-20 LAB — PSA: PSA: 0.5 ng/mL (ref 0.10–4.00)

## 2012-08-25 ENCOUNTER — Other Ambulatory Visit: Payer: Self-pay | Admitting: Family Medicine

## 2012-08-27 ENCOUNTER — Ambulatory Visit (INDEPENDENT_AMBULATORY_CARE_PROVIDER_SITE_OTHER): Payer: Medicare Other | Admitting: Family Medicine

## 2012-08-27 ENCOUNTER — Encounter: Payer: Self-pay | Admitting: Family Medicine

## 2012-08-27 VITALS — BP 136/78 | HR 54 | Temp 98.0°F | Ht 74.0 in | Wt 181.0 lb

## 2012-08-27 DIAGNOSIS — I10 Essential (primary) hypertension: Secondary | ICD-10-CM

## 2012-08-27 DIAGNOSIS — Z0000 Encounter for general adult medical examination without abnormal findings: Secondary | ICD-10-CM

## 2012-08-27 MED ORDER — LEVOTHYROXINE SODIUM 150 MCG PO TABS: 150.0000 ug | ORAL_TABLET | Freq: Every day | ORAL | Status: DC

## 2012-08-27 MED ORDER — SIMVASTATIN 40 MG PO TABS: 40.0000 mg | ORAL_TABLET | Freq: Every day | ORAL | Status: DC

## 2012-08-27 NOTE — Progress Notes (Signed)
  Subjective:    Patient ID: Selinda Flavin, male    DOB: 01-10-1946, 67 y.o.   MRN: [id number]  HPI 67 yr old male for a cpx. He feels well. He retired 2 weeks ago and is enjoying his free time.    Review of Systems  Constitutional: Negative.   HENT: Negative.   Eyes: Negative.   Respiratory: Negative.   Cardiovascular: Negative.   Gastrointestinal: Negative.   Genitourinary: Negative.   Musculoskeletal: Negative.   Skin: Negative.   Neurological: Negative.   Psychiatric/Behavioral: Negative.        Objective:   Physical Exam  Constitutional: He is oriented to person, place, and time. He appears well-developed and well-nourished. No distress.  HENT:  Head: Normocephalic and atraumatic.  Right Ear: External ear normal.  Left Ear: External ear normal.  Nose: Nose normal.  Mouth/Throat: Oropharynx is clear and moist. No oropharyngeal exudate.  Eyes: Conjunctivae and EOM are normal. Pupils are equal, round, and reactive to light. Right eye exhibits no discharge. Left eye exhibits no discharge. No scleral icterus.  Neck: Neck supple. No JVD present. No tracheal deviation present. No thyromegaly present.  Cardiovascular: Normal rate, regular rhythm, normal heart sounds and intact distal pulses.  Exam reveals no gallop and no friction rub.   No murmur heard. EKG normal   Pulmonary/Chest: Effort normal and breath sounds normal. No respiratory distress. He has no wheezes. He has no rales. He exhibits no tenderness.  Abdominal: Soft. Bowel sounds are normal. He exhibits no distension. There is no tenderness. There is no rebound and no guarding.  Small reducible nontender umbilical hernia   Genitourinary: Rectum normal, prostate normal and penis normal. Guaiac negative stool. No penile tenderness.  Musculoskeletal: Normal range of motion. He exhibits no edema and no tenderness.  Lymphadenopathy:    He has no cervical adenopathy.  Neurological: He is alert and oriented to person,  place, and time. He has normal reflexes. No cranial nerve deficit. He exhibits normal muscle tone. Coordination normal.  Skin: Skin is warm and dry. No rash noted. He is not diaphoretic. No erythema. No pallor.  Psychiatric: He has a normal mood and affect. His behavior is normal. Judgment and thought content normal.          Assessment & Plan:  Well exam.

## 2012-08-27 NOTE — Progress Notes (Signed)
Quick Note:  Pt is here now for CPE will go over then. ______

## 2012-09-16 ENCOUNTER — Other Ambulatory Visit: Payer: Self-pay | Admitting: Family Medicine

## 2012-10-07 ENCOUNTER — Other Ambulatory Visit: Payer: Self-pay | Admitting: Family Medicine

## 2012-11-17 ENCOUNTER — Other Ambulatory Visit: Payer: Self-pay | Admitting: Family Medicine

## 2013-03-11 ENCOUNTER — Other Ambulatory Visit: Payer: Self-pay | Admitting: Dermatology

## 2013-08-24 ENCOUNTER — Other Ambulatory Visit (INDEPENDENT_AMBULATORY_CARE_PROVIDER_SITE_OTHER): Payer: Commercial Managed Care - HMO

## 2013-08-24 DIAGNOSIS — Z0000 Encounter for general adult medical examination without abnormal findings: Secondary | ICD-10-CM

## 2013-08-24 LAB — BASIC METABOLIC PANEL
BUN: 12 mg/dL (ref 6–23)
CALCIUM: 9 mg/dL (ref 8.4–10.5)
CO2: 28 mEq/L (ref 19–32)
Chloride: 103 mEq/L (ref 96–112)
Creatinine, Ser: 1 mg/dL (ref 0.4–1.5)
GFR: 83.99 mL/min (ref >60.00)
GLUCOSE: 96 mg/dL (ref 70–99)
POTASSIUM: 4.1 meq/L (ref 3.5–5.1)
SODIUM: 137 meq/L (ref 135–145)

## 2013-08-24 LAB — CBC WITH DIFFERENTIAL/PLATELET
BASOS ABS: 0 10*3/uL (ref 0.0–0.1)
Basophils Relative: 0.5 % (ref 0.0–3.0)
EOS PCT: 4.4 % (ref 0.0–5.0)
Eosinophils Absolute: 0.2 10*3/uL (ref 0.0–0.7)
HEMATOCRIT: 46.9 % (ref 39.0–52.0)
HEMOGLOBIN: 15.9 g/dL (ref 13.0–17.0)
LYMPHS PCT: 27.2 % (ref 12.0–46.0)
Lymphs Abs: 1.4 10*3/uL (ref 0.7–4.0)
MCHC: 33.8 g/dL (ref 30.0–36.0)
MCV: 89.8 fl (ref 78.0–100.0)
MONOS PCT: 8.3 % (ref 3.0–12.0)
Monocytes Absolute: 0.4 10*3/uL (ref 0.1–1.0)
NEUTROS ABS: 3 10*3/uL (ref 1.4–7.7)
Neutrophils Relative %: 59.6 % (ref 43.0–77.0)
Platelets: 117 10*3/uL — ABNORMAL LOW (ref 150.0–400.0)
RBC: 5.23 Mil/uL (ref 4.22–5.81)
RDW: 14.3 % (ref 11.5–15.5)
WBC: 5.1 10*3/uL (ref 4.0–10.5)

## 2013-08-24 LAB — LIPID PANEL
CHOLESTEROL: 151 mg/dL (ref 0–200)
HDL: 48.7 mg/dL (ref >39.00)
LDL CALC: 85 mg/dL (ref 0–99)
NONHDL: 102.3
Total CHOL/HDL Ratio: 3
Triglycerides: 88 mg/dL (ref 0.0–149.0)
VLDL: 17.6 mg/dL (ref 0.0–40.0)

## 2013-08-24 LAB — POCT URINALYSIS DIPSTICK
BILIRUBIN UA: NEGATIVE
Glucose, UA: NEGATIVE
KETONES UA: NEGATIVE
Leukocytes, UA: NEGATIVE
Nitrite, UA: NEGATIVE
PH UA: 7.5
RBC UA: NEGATIVE
Spec Grav, UA: 1.015
Urobilinogen, UA: 0.2

## 2013-08-24 LAB — HEPATIC FUNCTION PANEL
ALK PHOS: 54 U/L (ref 39–117)
ALT: 21 U/L (ref 0–53)
AST: 19 U/L (ref 0–37)
Albumin: 3.8 g/dL (ref 3.5–5.2)
BILIRUBIN DIRECT: 0.2 mg/dL (ref 0.0–0.3)
TOTAL PROTEIN: 6.5 g/dL (ref 6.0–8.3)
Total Bilirubin: 1 mg/dL (ref 0.2–1.2)

## 2013-08-24 LAB — TSH: TSH: 2.81 u[IU]/mL (ref 0.35–4.50)

## 2013-08-24 LAB — PSA: PSA: 0.53 ng/mL (ref 0.10–4.00)

## 2013-08-30 ENCOUNTER — Encounter: Payer: Self-pay | Admitting: Family Medicine

## 2013-08-30 ENCOUNTER — Ambulatory Visit (INDEPENDENT_AMBULATORY_CARE_PROVIDER_SITE_OTHER): Payer: Commercial Managed Care - HMO | Admitting: Family Medicine

## 2013-08-30 VITALS — BP 166/81 | HR 57 | Temp 99.1°F | Ht 74.0 in | Wt 180.0 lb

## 2013-08-30 DIAGNOSIS — E785 Hyperlipidemia, unspecified: Secondary | ICD-10-CM

## 2013-08-30 DIAGNOSIS — Z0000 Encounter for general adult medical examination without abnormal findings: Secondary | ICD-10-CM

## 2013-08-30 MED ORDER — METOPROLOL SUCCINATE ER 50 MG PO TB24: ORAL_TABLET | ORAL | Status: DC

## 2013-08-30 MED ORDER — SIMVASTATIN 40 MG PO TABS: 40.0000 mg | ORAL_TABLET | Freq: Every day | ORAL | Status: DC

## 2013-08-30 MED ORDER — HYDROCHLOROTHIAZIDE 25 MG PO TABS: 25.0000 mg | ORAL_TABLET | Freq: Every day | ORAL | Status: DC

## 2013-08-30 MED ORDER — ESOMEPRAZOLE MAGNESIUM 20 MG PO CPDR: 20.0000 mg | DELAYED_RELEASE_CAPSULE | Freq: Every day | ORAL | Status: DC

## 2013-08-30 MED ORDER — LEVOTHYROXINE SODIUM 150 MCG PO TABS
150.0000 ug | ORAL_TABLET | Freq: Every day | ORAL | Status: DC
Start: 2013-08-30 — End: 2014-09-02

## 2013-08-30 NOTE — Progress Notes (Signed)
   Subjective:    Patient ID: Sean Mcgee, male    DOB: [birth year], 68 y.o.   MRN: [id number]  HPI 68 yr old male for a cpx. He feels well.    Review of Systems  Constitutional: Negative.   HENT: Negative.   Eyes: Negative.   Respiratory: Negative.   Cardiovascular: Negative.   Gastrointestinal: Negative.   Genitourinary: Negative.   Musculoskeletal: Negative.   Skin: Negative.   Neurological: Negative.   Psychiatric/Behavioral: Negative.        Objective:   Physical Exam  Constitutional: He is oriented to person, place, and time. He appears well-developed and well-nourished. No distress.  HENT:  Head: Normocephalic and atraumatic.  Right Ear: External ear normal.  Left Ear: External ear normal.  Nose: Nose normal.  Mouth/Throat: Oropharynx is clear and moist. No oropharyngeal exudate.  Eyes: Conjunctivae and EOM are normal. Pupils are equal, round, and reactive to light. Right eye exhibits no discharge. Left eye exhibits no discharge. No scleral icterus.  Neck: Neck supple. No JVD present. No tracheal deviation present. No thyromegaly present.  Cardiovascular: Normal rate, regular rhythm, normal heart sounds and intact distal pulses.  Exam reveals no gallop and no friction rub.   No murmur heard. EKG normal   Pulmonary/Chest: Effort normal and breath sounds normal. No respiratory distress. He has no wheezes. He has no rales. He exhibits no tenderness.  Abdominal: Soft. Bowel sounds are normal. He exhibits no distension and no mass. There is no tenderness. There is no rebound and no guarding.  Genitourinary: Rectum normal, prostate normal and penis normal. Guaiac negative stool. No penile tenderness.  Musculoskeletal: Normal range of motion. He exhibits no edema and no tenderness.  Lymphadenopathy:    He has no cervical adenopathy.  Neurological: He is alert and oriented to person, place, and time. He has normal reflexes. No cranial nerve deficit. He exhibits normal  muscle tone. Coordination normal.  Skin: Skin is warm and dry. No rash noted. He is not diaphoretic. No erythema. No pallor.  Psychiatric: He has a normal mood and affect. His behavior is normal. Judgment and thought content normal.          Assessment & Plan:  Well exam. We will add HCTZ 25 mg daily to get the systolic pressure down. Recheck one month

## 2013-08-30 NOTE — Progress Notes (Signed)
Pre visit review using our clinic review tool, if applicable. No additional management support is needed unless otherwise documented below in the visit note. 

## 2013-12-31 ENCOUNTER — Other Ambulatory Visit: Payer: Self-pay | Admitting: Dermatology

## 2014-08-29 ENCOUNTER — Other Ambulatory Visit (INDEPENDENT_AMBULATORY_CARE_PROVIDER_SITE_OTHER): Payer: Commercial Managed Care - HMO

## 2014-08-29 DIAGNOSIS — Z0000 Encounter for general adult medical examination without abnormal findings: Secondary | ICD-10-CM | POA: Diagnosis not present

## 2014-08-29 DIAGNOSIS — Z125 Encounter for screening for malignant neoplasm of prostate: Secondary | ICD-10-CM

## 2014-08-29 LAB — BASIC METABOLIC PANEL
BUN: 14 mg/dL (ref 6–23)
CHLORIDE: 105 meq/L (ref 96–112)
CO2: 27 mEq/L (ref 19–32)
Calcium: 9.2 mg/dL (ref 8.4–10.5)
Creatinine, Ser: 0.97 mg/dL (ref 0.40–1.50)
GFR: 81.74 mL/min (ref >60.00)
Glucose, Bld: 93 mg/dL (ref 70–99)
Potassium: 4.2 mEq/L (ref 3.5–5.1)
Sodium: 139 mEq/L (ref 135–145)

## 2014-08-29 LAB — CBC WITH DIFFERENTIAL/PLATELET
Basophils Absolute: 0 10*3/uL (ref 0.0–0.1)
Basophils Relative: 0.5 % (ref 0.0–3.0)
EOS ABS: 0.3 10*3/uL (ref 0.0–0.7)
Eosinophils Relative: 5.4 % — ABNORMAL HIGH (ref 0.0–5.0)
HCT: 46.2 % (ref 39.0–52.0)
Hemoglobin: 16 g/dL (ref 13.0–17.0)
Lymphocytes Relative: 28.3 % (ref 12.0–46.0)
Lymphs Abs: 1.4 10*3/uL (ref 0.7–4.0)
MCHC: 34.6 g/dL (ref 30.0–36.0)
MCV: 88.8 fl (ref 78.0–100.0)
MONO ABS: 0.5 10*3/uL (ref 0.1–1.0)
MONOS PCT: 9.1 % (ref 3.0–12.0)
Neutro Abs: 2.8 10*3/uL (ref 1.4–7.7)
Neutrophils Relative %: 56.7 % (ref 43.0–77.0)
Platelets: 122 10*3/uL — ABNORMAL LOW (ref 150.0–400.0)
RBC: 5.2 Mil/uL (ref 4.22–5.81)
RDW: 14.1 % (ref 11.5–15.5)
WBC: 4.9 10*3/uL (ref 4.0–10.5)

## 2014-08-29 LAB — TSH: TSH: 2.52 u[IU]/mL (ref 0.35–4.50)

## 2014-08-29 LAB — LIPID PANEL
Cholesterol: 138 mg/dL (ref 0–200)
HDL: 44.7 mg/dL (ref >39.00)
LDL Cholesterol: 78 mg/dL (ref 0–99)
NonHDL: 93.3
Total CHOL/HDL Ratio: 3
Triglycerides: 76 mg/dL (ref 0.0–149.0)
VLDL: 15.2 mg/dL (ref 0.0–40.0)

## 2014-08-29 LAB — POCT URINALYSIS DIPSTICK
Bilirubin, UA: NEGATIVE
Blood, UA: NEGATIVE
Glucose, UA: NEGATIVE
KETONES UA: NEGATIVE
Leukocytes, UA: NEGATIVE
NITRITE UA: NEGATIVE
PH UA: 6.5
Protein, UA: NEGATIVE
Spec Grav, UA: 1.025
UROBILINOGEN UA: 0.2

## 2014-08-29 LAB — HEPATIC FUNCTION PANEL
ALT: 15 U/L (ref 0–53)
AST: 15 U/L (ref 0–37)
Albumin: 3.9 g/dL (ref 3.5–5.2)
Alkaline Phosphatase: 52 U/L (ref 39–117)
Bilirubin, Direct: 0.1 mg/dL (ref 0.0–0.3)
TOTAL PROTEIN: 6.6 g/dL (ref 6.0–8.3)
Total Bilirubin: 0.7 mg/dL (ref 0.2–1.2)

## 2014-08-29 LAB — PSA: PSA: 0.55 ng/mL (ref 0.10–4.00)

## 2014-09-02 ENCOUNTER — Encounter: Payer: Self-pay | Admitting: Family Medicine

## 2014-09-02 ENCOUNTER — Ambulatory Visit (INDEPENDENT_AMBULATORY_CARE_PROVIDER_SITE_OTHER): Payer: Commercial Managed Care - HMO | Admitting: Family Medicine

## 2014-09-02 VITALS — BP 159/81 | HR 52 | Temp 98.6°F | Ht 74.0 in | Wt 180.0 lb

## 2014-09-02 DIAGNOSIS — Z0000 Encounter for general adult medical examination without abnormal findings: Secondary | ICD-10-CM | POA: Diagnosis not present

## 2014-09-02 DIAGNOSIS — E785 Hyperlipidemia, unspecified: Secondary | ICD-10-CM

## 2014-09-02 MED ORDER — METOPROLOL SUCCINATE ER 50 MG PO TB24: ORAL_TABLET | ORAL | Status: DC

## 2014-09-02 MED ORDER — LEVOTHYROXINE SODIUM 150 MCG PO TABS: 150.0000 ug | ORAL_TABLET | Freq: Every day | ORAL | Status: DC

## 2014-09-02 MED ORDER — SIMVASTATIN 40 MG PO TABS: 40.0000 mg | ORAL_TABLET | Freq: Every day | ORAL | Status: DC

## 2014-09-02 NOTE — Progress Notes (Signed)
   Subjective:    Patient ID: Sean Mcgee, male    DOB: November 26, 1945, 69 y.o.   MRN: [id number]  HPI 69 yr old male for a cpx. He feels well and has no concerns. His small umbilical hernia is not changing and it does not bother him. His BP at home is stable in the 876O or 115B systolic over 26O.    Review of Systems  Constitutional: Negative.   HENT: Negative.   Eyes: Negative.   Respiratory: Negative.   Cardiovascular: Negative.   Gastrointestinal: Negative.   Genitourinary: Negative.   Musculoskeletal: Negative.   Skin: Negative.   Neurological: Negative.   Psychiatric/Behavioral: Negative.        Objective:   Physical Exam  Constitutional: He is oriented to person, place, and time. He appears well-developed and well-nourished. No distress.  HENT:  Head: Normocephalic and atraumatic.  Right Ear: External ear normal.  Left Ear: External ear normal.  Nose: Nose normal.  Mouth/Throat: Oropharynx is clear and moist. No oropharyngeal exudate.  Eyes: Conjunctivae and EOM are normal. Pupils are equal, round, and reactive to light. Right eye exhibits no discharge. Left eye exhibits no discharge. No scleral icterus.  Neck: Neck supple. No JVD present. No tracheal deviation present. No thyromegaly present.  Cardiovascular: Normal rate, regular rhythm, normal heart sounds and intact distal pulses.  Exam reveals no gallop and no friction rub.   No murmur heard. EKG normal   Pulmonary/Chest: Effort normal and breath sounds normal. No respiratory distress. He has no wheezes. He has no rales. He exhibits no tenderness.  Abdominal: Soft. Bowel sounds are normal. He exhibits no distension and no mass. There is no tenderness. There is no rebound and no guarding.  Small reducible nontender umbilical hernia   Genitourinary: Rectum normal, prostate normal and penis normal. Guaiac negative stool. No penile tenderness.  Musculoskeletal: Normal range of motion. He exhibits no edema or tenderness.    Lymphadenopathy:    He has no cervical adenopathy.  Neurological: He is alert and oriented to person, place, and time. He has normal reflexes. No cranial nerve deficit. He exhibits normal muscle tone. Coordination normal.  Skin: Skin is warm and dry. No rash noted. He is not diaphoretic. No erythema. No pallor.  Psychiatric: He has a normal mood and affect. His behavior is normal. Judgment and thought content normal.          Assessment & Plan:  Well exam. We discussed diet and exercise advice.

## 2014-09-02 NOTE — Progress Notes (Signed)
Pre visit review using our clinic review tool, if applicable. No additional management support is needed unless otherwise documented below in the visit note. 

## 2015-01-20 ENCOUNTER — Encounter: Payer: Self-pay | Admitting: Gastroenterology

## 2015-08-11 ENCOUNTER — Encounter: Payer: Self-pay | Admitting: Gastroenterology

## 2015-09-05 ENCOUNTER — Other Ambulatory Visit (INDEPENDENT_AMBULATORY_CARE_PROVIDER_SITE_OTHER): Payer: Commercial Managed Care - HMO

## 2015-09-05 DIAGNOSIS — Z0000 Encounter for general adult medical examination without abnormal findings: Secondary | ICD-10-CM | POA: Diagnosis not present

## 2015-09-05 LAB — POC URINALSYSI DIPSTICK (AUTOMATED)
Bilirubin, UA: NEGATIVE
Blood, UA: NEGATIVE
Glucose, UA: NEGATIVE
KETONES UA: NEGATIVE
LEUKOCYTES UA: NEGATIVE
Nitrite, UA: NEGATIVE
PROTEIN UA: NEGATIVE
Spec Grav, UA: 1.015
Urobilinogen, UA: 1
pH, UA: 7.5

## 2015-09-05 LAB — HEPATIC FUNCTION PANEL
ALT: 18 U/L (ref 0–53)
AST: 15 U/L (ref 0–37)
Albumin: 4 g/dL (ref 3.5–5.2)
Alkaline Phosphatase: 56 U/L (ref 39–117)
BILIRUBIN DIRECT: 0.2 mg/dL (ref 0.0–0.3)
BILIRUBIN TOTAL: 1 mg/dL (ref 0.2–1.2)
TOTAL PROTEIN: 6.6 g/dL (ref 6.0–8.3)

## 2015-09-05 LAB — BASIC METABOLIC PANEL
BUN: 12 mg/dL (ref 6–23)
CHLORIDE: 104 meq/L (ref 96–112)
CO2: 29 meq/L (ref 19–32)
CREATININE: 0.96 mg/dL (ref 0.40–1.50)
Calcium: 9.2 mg/dL (ref 8.4–10.5)
GFR: 82.48 mL/min (ref >60.00)
GLUCOSE: 93 mg/dL (ref 70–99)
Potassium: 4.3 mEq/L (ref 3.5–5.1)
Sodium: 138 mEq/L (ref 135–145)

## 2015-09-05 LAB — CBC WITH DIFFERENTIAL/PLATELET
BASOS PCT: 0.4 % (ref 0.0–3.0)
Basophils Absolute: 0 10*3/uL (ref 0.0–0.1)
EOS ABS: 0.2 10*3/uL (ref 0.0–0.7)
Eosinophils Relative: 4.3 % (ref 0.0–5.0)
HEMATOCRIT: 47.5 % (ref 39.0–52.0)
Hemoglobin: 16.1 g/dL (ref 13.0–17.0)
Lymphocytes Relative: 24.5 % (ref 12.0–46.0)
Lymphs Abs: 1.3 10*3/uL (ref 0.7–4.0)
MCHC: 33.9 g/dL (ref 30.0–36.0)
MCV: 89.1 fl (ref 78.0–100.0)
MONO ABS: 0.4 10*3/uL (ref 0.1–1.0)
Monocytes Relative: 7 % (ref 3.0–12.0)
NEUTROS ABS: 3.5 10*3/uL (ref 1.4–7.7)
Neutrophils Relative %: 63.8 % (ref 43.0–77.0)
PLATELETS: 130 10*3/uL — AB (ref 150.0–400.0)
RBC: 5.33 Mil/uL (ref 4.22–5.81)
RDW: 14 % (ref 11.5–15.5)
WBC: 5.4 10*3/uL (ref 4.0–10.5)

## 2015-09-05 LAB — LIPID PANEL
CHOL/HDL RATIO: 3
Cholesterol: 154 mg/dL (ref 0–200)
HDL: 55.8 mg/dL (ref >39.00)
LDL Cholesterol: 82 mg/dL (ref 0–99)
NonHDL: 98.63
TRIGLYCERIDES: 83 mg/dL (ref 0.0–149.0)
VLDL: 16.6 mg/dL (ref 0.0–40.0)

## 2015-09-05 LAB — PSA: PSA: 0.61 ng/mL (ref 0.10–4.00)

## 2015-09-05 LAB — TSH: TSH: 3.46 u[IU]/mL (ref 0.35–4.50)

## 2015-09-08 ENCOUNTER — Other Ambulatory Visit: Payer: Self-pay | Admitting: Family Medicine

## 2015-09-12 ENCOUNTER — Ambulatory Visit (INDEPENDENT_AMBULATORY_CARE_PROVIDER_SITE_OTHER): Payer: Commercial Managed Care - HMO | Admitting: Family Medicine

## 2015-09-12 ENCOUNTER — Encounter: Payer: Self-pay | Admitting: Family Medicine

## 2015-09-12 VITALS — BP 154/77 | HR 48 | Temp 97.9°F | Ht 74.0 in | Wt 178.0 lb

## 2015-09-12 DIAGNOSIS — E785 Hyperlipidemia, unspecified: Secondary | ICD-10-CM | POA: Diagnosis not present

## 2015-09-12 DIAGNOSIS — Z0000 Encounter for general adult medical examination without abnormal findings: Secondary | ICD-10-CM | POA: Diagnosis not present

## 2015-09-12 MED ORDER — METOPROLOL SUCCINATE ER 50 MG PO TB24: 50.0000 mg | ORAL_TABLET | Freq: Every day | ORAL | 11 refills | Status: DC

## 2015-09-12 MED ORDER — SIMVASTATIN 40 MG PO TABS: 40.0000 mg | ORAL_TABLET | Freq: Every day | ORAL | 11 refills | Status: DC

## 2015-09-12 MED ORDER — LEVOTHYROXINE SODIUM 150 MCG PO TABS: 150.0000 ug | ORAL_TABLET | Freq: Every day | ORAL | 11 refills | Status: DC

## 2015-09-12 NOTE — Progress Notes (Signed)
Pre visit review using our clinic review tool, if applicable. No additional management support is needed unless otherwise documented below in the visit note. 

## 2015-09-12 NOTE — Progress Notes (Signed)
   Subjective:    Patient ID: Sean Mcgee, male    DOB: [birth year], 70 y.o.   MRN: [id number]  HPI 70 yr old male for a well exam. He feels great. His BP at home runs 130s to 140s over 80s.    Review of Systems  Constitutional: Negative.   HENT: Negative.   Eyes: Negative.   Respiratory: Negative.   Cardiovascular: Negative.   Gastrointestinal: Negative.   Genitourinary: Negative.   Musculoskeletal: Negative.   Skin: Negative.   Neurological: Negative.   Psychiatric/Behavioral: Negative.        Objective:   Physical Exam  Constitutional: He is oriented to person, place, and time. He appears well-developed and well-nourished. No distress.  HENT:  Head: Normocephalic and atraumatic.  Right Ear: External ear normal.  Left Ear: External ear normal.  Nose: Nose normal.  Mouth/Throat: Oropharynx is clear and moist. No oropharyngeal exudate.  Eyes: Conjunctivae and EOM are normal. Pupils are equal, round, and reactive to light. Right eye exhibits no discharge. Left eye exhibits no discharge. No scleral icterus.  Neck: Neck supple. No JVD present. No tracheal deviation present. No thyromegaly present.  Cardiovascular: Normal rate, regular rhythm, normal heart sounds and intact distal pulses.  Exam reveals no gallop and no friction rub.   No murmur heard. EKG normal   Pulmonary/Chest: Effort normal and breath sounds normal. No respiratory distress. He has no wheezes. He has no rales. He exhibits no tenderness.  Abdominal: Soft. Bowel sounds are normal. He exhibits no distension and no mass. There is no tenderness. There is no rebound and no guarding.  Genitourinary: Rectum normal, prostate normal and penis normal. Rectal exam shows guaiac negative stool. No penile tenderness.  Musculoskeletal: Normal range of motion. He exhibits no edema or tenderness.  Lymphadenopathy:    He has no cervical adenopathy.  Neurological: He is alert and oriented to person, place, and time. He has  normal reflexes. No cranial nerve deficit. He exhibits normal muscle tone. Coordination normal.  Skin: Skin is warm and dry. No rash noted. He is not diaphoretic. No erythema. No pallor.  Psychiatric: He has a normal mood and affect. His behavior is normal. Judgment and thought content normal.          Assessment & Plan:  Well exam. We discussed diet and exercise. He will have another colonoscopy later this year.  Laurey Morale, MD

## 2016-02-27 DIAGNOSIS — D225 Melanocytic nevi of trunk: Secondary | ICD-10-CM | POA: Diagnosis not present

## 2016-02-27 DIAGNOSIS — Z23 Encounter for immunization: Secondary | ICD-10-CM | POA: Diagnosis not present

## 2016-02-27 DIAGNOSIS — Z85828 Personal history of other malignant neoplasm of skin: Secondary | ICD-10-CM | POA: Diagnosis not present

## 2016-02-27 DIAGNOSIS — L821 Other seborrheic keratosis: Secondary | ICD-10-CM | POA: Diagnosis not present

## 2016-02-27 DIAGNOSIS — L570 Actinic keratosis: Secondary | ICD-10-CM | POA: Diagnosis not present

## 2016-02-27 DIAGNOSIS — L111 Transient acantholytic dermatosis [Grover]: Secondary | ICD-10-CM | POA: Diagnosis not present

## 2016-03-27 ENCOUNTER — Encounter: Payer: Self-pay | Admitting: Gastroenterology

## 2016-05-01 ENCOUNTER — Ambulatory Visit (AMBULATORY_SURGERY_CENTER): Payer: Self-pay | Admitting: *Deleted

## 2016-05-01 VITALS — Ht 74.0 in | Wt 184.0 lb

## 2016-05-01 DIAGNOSIS — Z1211 Encounter for screening for malignant neoplasm of colon: Secondary | ICD-10-CM

## 2016-05-01 MED ORDER — NA SULFATE-K SULFATE-MG SULF 17.5-3.13-1.6 GM/177ML PO SOLN: ORAL | 0 refills | Status: DC

## 2016-05-01 NOTE — Progress Notes (Signed)
Patient denies any allergies to eggs or soy. Patient denies any problems with anesthesia/sedation. Patient denies any oxygen use at home and does not take any diet/weight loss medications. EMMI education declined by patient.  

## 2016-05-07 ENCOUNTER — Encounter: Payer: Self-pay | Admitting: Gastroenterology

## 2016-05-15 ENCOUNTER — Ambulatory Visit (AMBULATORY_SURGERY_CENTER): Payer: Medicare HMO | Admitting: Gastroenterology

## 2016-05-15 ENCOUNTER — Encounter: Payer: Self-pay | Admitting: Gastroenterology

## 2016-05-15 VITALS — BP 126/70 | HR 55 | Temp 98.0°F | Resp 24 | Ht 74.0 in | Wt 184.0 lb

## 2016-05-15 DIAGNOSIS — D120 Benign neoplasm of cecum: Secondary | ICD-10-CM | POA: Diagnosis not present

## 2016-05-15 DIAGNOSIS — I10 Essential (primary) hypertension: Secondary | ICD-10-CM | POA: Diagnosis not present

## 2016-05-15 DIAGNOSIS — Z1211 Encounter for screening for malignant neoplasm of colon: Secondary | ICD-10-CM

## 2016-05-15 DIAGNOSIS — D122 Benign neoplasm of ascending colon: Secondary | ICD-10-CM

## 2016-05-15 DIAGNOSIS — Z1212 Encounter for screening for malignant neoplasm of rectum: Secondary | ICD-10-CM

## 2016-05-15 DIAGNOSIS — K219 Gastro-esophageal reflux disease without esophagitis: Secondary | ICD-10-CM | POA: Diagnosis not present

## 2016-05-15 HISTORY — PX: COLONOSCOPY: SHX174

## 2016-05-15 MED ORDER — SODIUM CHLORIDE 0.9 % IV SOLN: 500.0000 mL | INTRAVENOUS | Status: DC

## 2016-05-15 NOTE — Progress Notes (Signed)
Called to room to assist during endoscopic procedure.  Patient ID and intended procedure confirmed with present staff. Received instructions for my participation in the procedure from the performing physician.  

## 2016-05-15 NOTE — Progress Notes (Signed)
Report to PACU, RN, vss, BBS= Clear.  

## 2016-05-15 NOTE — Progress Notes (Signed)
Pt's states no medical or surgical changes since previsit or office visit. 

## 2016-05-15 NOTE — Op Note (Signed)
Sean Mcgee Patient Name: Sean Mcgee Procedure Date: 05/15/2016 7:42 AM MRN: [id number] Endoscopist: Remo Lipps P. Armbruster MD, MD Age: 71 Referring MD:  Date of Birth: [birth year] Gender: Male Account #: [id number] Procedure:                Colonoscopy Indications:              Screening for colorectal malignant neoplasm Medicines:                Monitored Anesthesia Care Procedure:                Pre-Anesthesia Assessment:                           - Prior to the procedure, a History and Physical                            was performed, and patient medications and                            allergies were reviewed. The patient's tolerance of                            previous anesthesia was also reviewed. The risks                            and benefits of the procedure and the sedation                            options and risks were discussed with the patient.                            All questions were answered, and informed consent                            was obtained. Prior Anticoagulants: The patient has                            taken no previous anticoagulant or antiplatelet                            agents. ASA Grade Assessment: II - A patient with                            mild systemic disease. After reviewing the risks                            and benefits, the patient was deemed in                            satisfactory condition to undergo the procedure.                           After obtaining informed consent, the colonoscope  was passed under direct vision. Throughout the                            procedure, the patient's blood pressure, pulse, and                            oxygen saturations were monitored continuously. The                            Colonoscope was introduced through the anus and                            advanced to the the cecum, identified by                            appendiceal orifice and  ileocecal valve. The                            colonoscopy was performed without difficulty. The                            patient tolerated the procedure well. The quality                            of the bowel preparation was adequate. The                            ileocecal valve, appendiceal orifice, and rectum                            were photographed. Scope In: 7:59:36 AM Scope Out: 8:23:51 AM Scope Withdrawal Time: 0 hours 21 minutes 26 seconds  Total Procedure Duration: 0 hours 24 minutes 15 seconds  Findings:                 The perianal and digital rectal examinations were                            normal.                           A diminutive polyp was found in the cecum. The                            polyp was sessile. The polyp was removed with a                            cold biopsy forceps. Resection and retrieval were                            complete.                           Two sessile polyps were found in the ascending  colon. The polyps were diminutive in size. These                            polyps were removed with a cold biopsy forceps.                            Resection and retrieval were complete.                           Two flat polyps were found in the ascending colon.                            The polyps were 6 to 8 mm in size. These polyps                            were removed with a cold snare. Resection and                            retrieval were complete.                           Multiple medium-mouthed diverticula were found in                            the sigmoid colon.                           Internal hemorrhoids were found during retroflexion.                           The exam was otherwise without abnormality. Complications:            No immediate complications. Estimated blood loss:                            Minimal. Estimated Blood Loss:     Estimated blood loss was minimal. Impression:                - One diminutive polyp in the cecum, removed with a                            cold biopsy forceps. Resected and retrieved.                           - Two diminutive polyps in the ascending colon,                            removed with a cold biopsy forceps. Resected and                            retrieved.                           - Two 6 to 8 mm polyps in the ascending colon,  removed with a cold snare. Resected and retrieved.                           - Diverticulosis in the sigmoid colon.                           - Internal hemorrhoids.                           - The examination was otherwise normal. Recommendation:           - Patient has a contact number available for                            emergencies. The signs and symptoms of potential                            delayed complications were discussed with the                            patient. Return to normal activities tomorrow.                            Written discharge instructions were provided to the                            patient.                           - Resume previous diet.                           - Continue present medications.                           - Await pathology results.                           - Repeat colonoscopy is recommended for                            surveillance. The colonoscopy date will be                            determined after pathology results from today's                            exam become available for review.                           - No ibuprofen, naproxen, or other non-steroidal                            anti-inflammatory drugs for 2 weeks after polyp                            removal. Remo Lipps P. Armbruster  MD, MD 05/15/2016 8:28:19 AM This report has been signed electronically.

## 2016-05-16 ENCOUNTER — Telehealth: Payer: Self-pay | Admitting: *Deleted

## 2016-05-16 NOTE — Telephone Encounter (Signed)
  Follow up Call-  Call back number 05/15/2016  Post procedure Call Back phone  # [phone]  Permission to leave phone message Yes  Some recent data might be hidden     Patient questions:  Do you have a fever, pain , or abdominal swelling? No. Pain Score  0 *  Have you tolerated food without any problems? Yes.    Have you been able to return to your normal activities? Yes.    Do you have any questions about your discharge instructions: Diet   No. Medications  No. Follow up visit  No.  Do you have questions or concerns about your Care? No.  Actions: * If pain score is 4 or above: No action needed, pain <4.

## 2016-05-21 ENCOUNTER — Encounter: Payer: Self-pay | Admitting: Gastroenterology

## 2016-09-18 ENCOUNTER — Other Ambulatory Visit: Payer: Self-pay | Admitting: Family Medicine

## 2016-10-02 ENCOUNTER — Other Ambulatory Visit: Payer: Self-pay | Admitting: Family Medicine

## 2016-10-04 DIAGNOSIS — D225 Melanocytic nevi of trunk: Secondary | ICD-10-CM | POA: Diagnosis not present

## 2016-10-04 DIAGNOSIS — Z85828 Personal history of other malignant neoplasm of skin: Secondary | ICD-10-CM | POA: Diagnosis not present

## 2016-10-04 DIAGNOSIS — L570 Actinic keratosis: Secondary | ICD-10-CM | POA: Diagnosis not present

## 2016-10-04 DIAGNOSIS — L821 Other seborrheic keratosis: Secondary | ICD-10-CM | POA: Diagnosis not present

## 2016-10-04 DIAGNOSIS — D2272 Melanocytic nevi of left lower limb, including hip: Secondary | ICD-10-CM | POA: Diagnosis not present

## 2016-10-04 DIAGNOSIS — D1730 Benign lipomatous neoplasm of skin and subcutaneous tissue of unspecified sites: Secondary | ICD-10-CM | POA: Diagnosis not present

## 2016-10-15 ENCOUNTER — Other Ambulatory Visit: Payer: Self-pay | Admitting: Family Medicine

## 2016-10-22 ENCOUNTER — Encounter: Payer: Self-pay | Admitting: Family Medicine

## 2016-10-22 ENCOUNTER — Ambulatory Visit (INDEPENDENT_AMBULATORY_CARE_PROVIDER_SITE_OTHER): Payer: Medicare HMO | Admitting: Family Medicine

## 2016-10-22 VITALS — BP 193/93 | HR 45 | Temp 98.4°F | Ht 74.0 in | Wt 181.0 lb

## 2016-10-22 DIAGNOSIS — E782 Mixed hyperlipidemia: Secondary | ICD-10-CM | POA: Diagnosis not present

## 2016-10-22 DIAGNOSIS — I10 Essential (primary) hypertension: Secondary | ICD-10-CM | POA: Diagnosis not present

## 2016-10-22 DIAGNOSIS — E039 Hypothyroidism, unspecified: Secondary | ICD-10-CM

## 2016-10-22 DIAGNOSIS — K219 Gastro-esophageal reflux disease without esophagitis: Secondary | ICD-10-CM

## 2016-10-22 LAB — POC URINALSYSI DIPSTICK (AUTOMATED)
BILIRUBIN UA: NEGATIVE
GLUCOSE UA: NEGATIVE
Ketones, UA: NEGATIVE
Leukocytes, UA: NEGATIVE
Nitrite, UA: NEGATIVE
Protein, UA: NEGATIVE
RBC UA: NEGATIVE
SPEC GRAV UA: 1.015 (ref 1.010–1.025)
UROBILINOGEN UA: 0.2 U/dL
pH, UA: 7 (ref 5.0–8.0)

## 2016-10-22 LAB — BASIC METABOLIC PANEL
BUN: 9 mg/dL (ref 6–23)
CALCIUM: 9.2 mg/dL (ref 8.4–10.5)
CHLORIDE: 104 meq/L (ref 96–112)
CO2: 29 meq/L (ref 19–32)
Creatinine, Ser: 0.91 mg/dL (ref 0.40–1.50)
GFR: 87.44 mL/min (ref >60.00)
GLUCOSE: 96 mg/dL (ref 70–99)
POTASSIUM: 4.5 meq/L (ref 3.5–5.1)
SODIUM: 139 meq/L (ref 135–145)

## 2016-10-22 LAB — CBC WITH DIFFERENTIAL/PLATELET
BASOS PCT: 0.6 % (ref 0.0–3.0)
Basophils Absolute: 0 10*3/uL (ref 0.0–0.1)
EOS PCT: 5.7 % — AB (ref 0.0–5.0)
Eosinophils Absolute: 0.2 10*3/uL (ref 0.0–0.7)
HCT: 45.1 % (ref 39.0–52.0)
HEMOGLOBIN: 15.3 g/dL (ref 13.0–17.0)
LYMPHS PCT: 25 % (ref 12.0–46.0)
Lymphs Abs: 1.1 10*3/uL (ref 0.7–4.0)
MCHC: 33.9 g/dL (ref 30.0–36.0)
MCV: 90.2 fl (ref 78.0–100.0)
MONO ABS: 0.4 10*3/uL (ref 0.1–1.0)
Monocytes Relative: 10.2 % (ref 3.0–12.0)
NEUTROS ABS: 2.5 10*3/uL (ref 1.4–7.7)
Neutrophils Relative %: 58.5 % (ref 43.0–77.0)
PLATELETS: 127 10*3/uL — AB (ref 150.0–400.0)
RBC: 5 Mil/uL (ref 4.22–5.81)
RDW: 13.6 % (ref 11.5–15.5)
WBC: 4.3 10*3/uL (ref 4.0–10.5)

## 2016-10-22 LAB — HEPATIC FUNCTION PANEL
ALBUMIN: 3.9 g/dL (ref 3.5–5.2)
ALK PHOS: 58 U/L (ref 39–117)
ALT: 18 U/L (ref 0–53)
AST: 19 U/L (ref 0–37)
BILIRUBIN DIRECT: 0.2 mg/dL (ref 0.0–0.3)
TOTAL PROTEIN: 6.3 g/dL (ref 6.0–8.3)
Total Bilirubin: 0.9 mg/dL (ref 0.2–1.2)

## 2016-10-22 LAB — LIPID PANEL
Cholesterol: 141 mg/dL (ref 0–200)
HDL: 54.5 mg/dL (ref >39.00)
LDL Cholesterol: 70 mg/dL (ref 0–99)
NonHDL: 86.2
Total CHOL/HDL Ratio: 3
Triglycerides: 80 mg/dL (ref 0.0–149.0)
VLDL: 16 mg/dL (ref 0.0–40.0)

## 2016-10-22 LAB — TSH: TSH: 0.11 u[IU]/mL — ABNORMAL LOW (ref 0.35–4.50)

## 2016-10-22 MED ORDER — SIMVASTATIN 40 MG PO TABS: 40.0000 mg | ORAL_TABLET | Freq: Every day | ORAL | 11 refills | Status: DC

## 2016-10-22 MED ORDER — LEVOTHYROXINE SODIUM 150 MCG PO TABS: ORAL_TABLET | ORAL | 11 refills | Status: DC

## 2016-10-22 MED ORDER — METOPROLOL SUCCINATE ER 50 MG PO TB24: ORAL_TABLET | ORAL | 11 refills | Status: DC

## 2016-10-22 NOTE — Progress Notes (Signed)
   Subjective:    Patient ID: Sean Mcgee, male    DOB: [birth year], 71 y.o.   MRN: [id number]  HPI Here to follow up issues. He feels great and has no concerns. He attended a prostate screening clinic at his workplace and he was told he was okay. This included a DRE and a PSA. His BP at home is stable in the 130s or 140s over 70s or 80s.    Review of Systems  Constitutional: Negative.   HENT: Negative.   Eyes: Negative.   Respiratory: Negative.   Cardiovascular: Negative.   Gastrointestinal: Negative.   Genitourinary: Negative.   Musculoskeletal: Negative.   Skin: Negative.   Neurological: Negative.   Psychiatric/Behavioral: Negative.        Objective:   Physical Exam  Constitutional: He is oriented to person, place, and time. He appears well-developed and well-nourished. No distress.  HENT:  Head: Normocephalic and atraumatic.  Right Ear: External ear normal.  Left Ear: External ear normal.  Nose: Nose normal.  Mouth/Throat: Oropharynx is clear and moist. No oropharyngeal exudate.  Eyes: Pupils are equal, round, and reactive to light. Conjunctivae and EOM are normal. Right eye exhibits no discharge. Left eye exhibits no discharge. No scleral icterus.  Neck: Neck supple. No JVD present. No tracheal deviation present. No thyromegaly present.  Cardiovascular: Normal rate, regular rhythm, normal heart sounds and intact distal pulses.  Exam reveals no gallop and no friction rub.   No murmur heard. Pulmonary/Chest: Effort normal and breath sounds normal. No respiratory distress. He has no wheezes. He has no rales. He exhibits no tenderness.  Abdominal: Soft. Bowel sounds are normal. He exhibits no distension and no mass. There is no tenderness. There is no rebound and no guarding.  Musculoskeletal: Normal range of motion. He exhibits no edema or tenderness.  Lymphadenopathy:    He has no cervical adenopathy.  Neurological: He is alert and oriented to person, place, and time. He  has normal reflexes. No cranial nerve deficit. He exhibits normal muscle tone. Coordination normal.  Skin: Skin is warm and dry. No rash noted. He is not diaphoretic. No erythema. No pallor.  Psychiatric: He has a normal mood and affect. His behavior is normal. Judgment and thought content normal.          Assessment & Plan:  His HTN is stable. We will get fasting labs to check his lipids and TSH, among other things. We reviewed diet and exercise advice.  Alysia Penna, MD

## 2016-10-22 NOTE — Patient Instructions (Signed)
WE NOW OFFER    [facility] FAST TRACK!!!  SAME DAY Appointments for ACUTE CARE  Such as: Sprains, Injuries, cuts, abrasions, rashes, muscle pain, joint pain, back pain Colds, flu, sore throats, headache, allergies, cough, fever  Ear pain, sinus and eye infections Abdominal pain, nausea, vomiting, diarrhea, upset stomach Animal/insect bites  3 Easy Ways to Schedule: Walk-In Scheduling Call in scheduling Mychart Sign-up: [email or web address]         

## 2016-10-31 ENCOUNTER — Encounter: Payer: Self-pay | Admitting: Family Medicine

## 2017-03-14 ENCOUNTER — Ambulatory Visit: Payer: Self-pay | Admitting: *Deleted

## 2017-03-14 NOTE — Telephone Encounter (Signed)
Noted  

## 2017-03-14 NOTE — Telephone Encounter (Signed)
Called in c/o having high BP since Monday.   He had a very stressful day on Monday that triggered him to check his BP.  He did not want to give details of what happened on Monday that triggered the stress. As a result of Monday he has been checking his BP several times a day all week and getting anywhere from 215/not specified to 150's over 70s.  I made him an appt wit Dr Sarajane Jews for Monday 03/17/17 at 10:30.  I instructed him to call us back if he developed symptoms that I went over with him between now and his appt on Monday.   He verbalized understanding.  Reason for Disposition . Systolic BP  >= 301 OR Diastolic >= 601  Answer Assessment - Initial Assessment Questions 1. BLOOD PRESSURE: "What is the blood pressure?" "Did you take at least two measurements 5 minutes apart?"     Usually 130s/60s;   Pulse 40-50.     172/77.   152/74.  Now 143/73  Pulse 49  Monday was a very stressful  Day for me that triggered. 2. ONSET: "When did you take your blood pressure?"     Just now   See above 3. HOW: "How did you obtain the blood pressure?" (e.g., visiting nurse, automatic home BP monitor)     Home BP machine 4. HISTORY: "Do you have a history of high blood pressure?"     Yes 5. MEDICATIONS: "Are you taking any medications for blood pressure?" "Have you missed any doses recently?"     Yes  Not missed any doses. 6. OTHER SYMPTOMS: "Do you have any symptoms?" (e.g., headache, chest pain, blurred vision, difficulty breathing, weakness)     No symptoms 7. PREGNANCY: "Is there any chance you are pregnant?" "When was your last menstrual period?"     N/A  Protocols used: HIGH BLOOD PRESSURE-A-AH

## 2017-03-17 ENCOUNTER — Ambulatory Visit (INDEPENDENT_AMBULATORY_CARE_PROVIDER_SITE_OTHER): Payer: Medicare HMO | Admitting: Family Medicine

## 2017-03-17 ENCOUNTER — Encounter: Payer: Self-pay | Admitting: Family Medicine

## 2017-03-17 VITALS — BP 170/62 | HR 57 | Temp 98.0°F | Wt 184.2 lb

## 2017-03-17 DIAGNOSIS — I10 Essential (primary) hypertension: Secondary | ICD-10-CM

## 2017-03-17 MED ORDER — BENAZEPRIL HCL 10 MG PO TABS: 10.0000 mg | ORAL_TABLET | Freq: Every day | ORAL | 3 refills | Status: DC

## 2017-03-17 NOTE — Progress Notes (Signed)
   Subjective:    Patient ID: Sean Mcgee, male    DOB: 16-Jul-1945, 72 y.o.   MRN: [id number]  HPI Here for high BP readings. Over the past month he has been getting some systolic readings in the 329V and 170s with normal diastolics. He feels fine. His labs in September were normal, including renal function.   Review of Systems  Constitutional: Negative.   Respiratory: Negative.   Cardiovascular: Negative.   Neurological: Negative.        Objective:   Physical Exam  Constitutional: He is oriented to person, place, and time. He appears well-developed and well-nourished.  Cardiovascular: Normal rate, regular rhythm, normal heart sounds and intact distal pulses.  Pulmonary/Chest: Effort normal and breath sounds normal. No respiratory distress. He has no wheezes.  Neurological: He is alert and oriented to person, place, and time.          Assessment & Plan:  HTN. We will add back Benazepril 10 mg daily to his Metoprolol. Recheck in 3-4 weeks.  Alysia Penna, MD

## 2017-08-29 DIAGNOSIS — H2513 Age-related nuclear cataract, bilateral: Secondary | ICD-10-CM | POA: Diagnosis not present

## 2017-10-27 ENCOUNTER — Other Ambulatory Visit: Payer: Self-pay | Admitting: Family Medicine

## 2017-11-05 ENCOUNTER — Encounter: Payer: Medicare HMO | Admitting: Family Medicine

## 2017-11-11 DIAGNOSIS — L570 Actinic keratosis: Secondary | ICD-10-CM | POA: Diagnosis not present

## 2017-11-11 DIAGNOSIS — Z85828 Personal history of other malignant neoplasm of skin: Secondary | ICD-10-CM | POA: Diagnosis not present

## 2017-11-11 DIAGNOSIS — L821 Other seborrheic keratosis: Secondary | ICD-10-CM | POA: Diagnosis not present

## 2017-11-11 DIAGNOSIS — Z23 Encounter for immunization: Secondary | ICD-10-CM | POA: Diagnosis not present

## 2017-11-11 DIAGNOSIS — D225 Melanocytic nevi of trunk: Secondary | ICD-10-CM | POA: Diagnosis not present

## 2017-11-15 ENCOUNTER — Other Ambulatory Visit: Payer: Self-pay | Admitting: Family Medicine

## 2017-11-17 ENCOUNTER — Encounter: Payer: Self-pay | Admitting: Family Medicine

## 2017-11-17 ENCOUNTER — Ambulatory Visit (INDEPENDENT_AMBULATORY_CARE_PROVIDER_SITE_OTHER): Payer: Medicare HMO | Admitting: Family Medicine

## 2017-11-17 VITALS — BP 132/78 | HR 53 | Temp 98.0°F | Ht 74.0 in | Wt 184.5 lb

## 2017-11-17 DIAGNOSIS — E782 Mixed hyperlipidemia: Secondary | ICD-10-CM | POA: Diagnosis not present

## 2017-11-17 DIAGNOSIS — N401 Enlarged prostate with lower urinary tract symptoms: Secondary | ICD-10-CM | POA: Diagnosis not present

## 2017-11-17 DIAGNOSIS — E039 Hypothyroidism, unspecified: Secondary | ICD-10-CM

## 2017-11-17 DIAGNOSIS — I10 Essential (primary) hypertension: Secondary | ICD-10-CM

## 2017-11-17 DIAGNOSIS — N138 Other obstructive and reflux uropathy: Secondary | ICD-10-CM | POA: Diagnosis not present

## 2017-11-17 DIAGNOSIS — K219 Gastro-esophageal reflux disease without esophagitis: Secondary | ICD-10-CM | POA: Diagnosis not present

## 2017-11-17 MED ORDER — METOPROLOL SUCCINATE ER 50 MG PO TB24: ORAL_TABLET | ORAL | 11 refills | Status: DC

## 2017-11-17 MED ORDER — SIMVASTATIN 40 MG PO TABS: 40.0000 mg | ORAL_TABLET | Freq: Every day | ORAL | 11 refills | Status: DC

## 2017-11-17 MED ORDER — LEVOTHYROXINE SODIUM 150 MCG PO TABS: ORAL_TABLET | ORAL | 11 refills | Status: DC

## 2017-11-17 NOTE — Progress Notes (Signed)
   Subjective:    Patient ID: Sean Mcgee, male    DOB: Nov 22, 1945, 72 y.o.   MRN: [id number]  HPI Here to follow up on issues. He feels great. His BP is stable. He watches his diet. He has a small umbilical hernia but this never bothers him.    Review of Systems  Constitutional: Negative.   HENT: Negative.   Eyes: Negative.   Respiratory: Negative.   Cardiovascular: Negative.   Gastrointestinal: Negative.   Genitourinary: Negative.   Musculoskeletal: Negative.   Skin: Negative.   Neurological: Negative.   Psychiatric/Behavioral: Negative.        Objective:   Physical Exam  Constitutional: He is oriented to person, place, and time. He appears well-developed and well-nourished. No distress.  HENT:  Head: Normocephalic and atraumatic.  Right Ear: External ear normal.  Left Ear: External ear normal.  Nose: Nose normal.  Mouth/Throat: Oropharynx is clear and moist. No oropharyngeal exudate.  Eyes: Pupils are equal, round, and reactive to light. Conjunctivae and EOM are normal. Right eye exhibits no discharge. Left eye exhibits no discharge. No scleral icterus.  Neck: Neck supple. No JVD present. No tracheal deviation present. No thyromegaly present.  Cardiovascular: Normal rate, regular rhythm, normal heart sounds and intact distal pulses. Exam reveals no gallop and no friction rub.  No murmur heard. Pulmonary/Chest: Effort normal and breath sounds normal. No respiratory distress. He has no wheezes. He has no rales. He exhibits no tenderness.  Abdominal: Soft. Bowel sounds are normal. He exhibits no distension and no mass. There is no tenderness. There is no rebound and no guarding.  Small reducible non-tender umbilical hernia   Genitourinary: Rectum normal, prostate normal and penis normal. Rectal exam shows guaiac negative stool. No penile tenderness.  Musculoskeletal: Normal range of motion. He exhibits no edema or tenderness.  Lymphadenopathy:    He has no cervical  adenopathy.  Neurological: He is alert and oriented to person, place, and time. He has normal reflexes. He displays normal reflexes. No cranial nerve deficit. He exhibits normal muscle tone. Coordination normal.  Skin: Skin is warm and dry. No rash noted. He is not diaphoretic. No erythema. No pallor.  Psychiatric: He has a normal mood and affect. His behavior is normal. Judgment and thought content normal.          Assessment & Plan:  His HTN is stable. His hernia is stable. We will get fasting labs to check his lipids and thyroid level, etc.  Alysia Penna, MD

## 2017-11-18 LAB — HEPATIC FUNCTION PANEL
ALK PHOS: 59 U/L (ref 39–117)
ALT: 16 U/L (ref 0–53)
AST: 13 U/L (ref 0–37)
Albumin: 3.9 g/dL (ref 3.5–5.2)
Bilirubin, Direct: 0.2 mg/dL (ref 0.0–0.3)
TOTAL PROTEIN: 6.7 g/dL (ref 6.0–8.3)
Total Bilirubin: 0.7 mg/dL (ref 0.2–1.2)

## 2017-11-18 LAB — CBC WITH DIFFERENTIAL/PLATELET
Basophils Absolute: 0 10*3/uL (ref 0.0–0.1)
Basophils Relative: 0.5 % (ref 0.0–3.0)
EOS ABS: 0.2 10*3/uL (ref 0.0–0.7)
Eosinophils Relative: 4.6 % (ref 0.0–5.0)
HEMATOCRIT: 45.6 % (ref 39.0–52.0)
HEMOGLOBIN: 15.7 g/dL (ref 13.0–17.0)
LYMPHS PCT: 25.3 % (ref 12.0–46.0)
Lymphs Abs: 1.1 10*3/uL (ref 0.7–4.0)
MCHC: 34.4 g/dL (ref 30.0–36.0)
MCV: 88.9 fl (ref 78.0–100.0)
MONOS PCT: 9.4 % (ref 3.0–12.0)
Monocytes Absolute: 0.4 10*3/uL (ref 0.1–1.0)
NEUTROS ABS: 2.7 10*3/uL (ref 1.4–7.7)
Neutrophils Relative %: 60.2 % (ref 43.0–77.0)
PLATELETS: 127 10*3/uL — AB (ref 150.0–400.0)
RBC: 5.13 Mil/uL (ref 4.22–5.81)
RDW: 13.6 % (ref 11.5–15.5)
WBC: 4.4 10*3/uL (ref 4.0–10.5)

## 2017-11-18 LAB — BASIC METABOLIC PANEL
BUN: 12 mg/dL (ref 6–23)
CALCIUM: 9 mg/dL (ref 8.4–10.5)
CO2: 29 meq/L (ref 19–32)
Chloride: 104 mEq/L (ref 96–112)
Creatinine, Ser: 0.99 mg/dL (ref 0.40–1.50)
GFR: 79.1 mL/min (ref >60.00)
Glucose, Bld: 98 mg/dL (ref 70–99)
Potassium: 4.2 mEq/L (ref 3.5–5.1)
SODIUM: 138 meq/L (ref 135–145)

## 2017-11-18 LAB — LIPID PANEL
CHOL/HDL RATIO: 3
Cholesterol: 146 mg/dL (ref 0–200)
HDL: 50.4 mg/dL (ref >39.00)
LDL CALC: 76 mg/dL (ref 0–99)
NONHDL: 95.64
Triglycerides: 96 mg/dL (ref 0.0–149.0)
VLDL: 19.2 mg/dL (ref 0.0–40.0)

## 2017-11-18 LAB — PSA: PSA: 0.98 ng/mL (ref 0.10–4.00)

## 2017-11-18 LAB — TSH: TSH: 0.22 u[IU]/mL — ABNORMAL LOW (ref 0.35–4.50)

## 2017-11-19 ENCOUNTER — Encounter: Payer: Self-pay | Admitting: *Deleted

## 2018-04-10 ENCOUNTER — Other Ambulatory Visit: Payer: Self-pay | Admitting: Family Medicine

## 2018-06-01 DIAGNOSIS — H43813 Vitreous degeneration, bilateral: Secondary | ICD-10-CM | POA: Diagnosis not present

## 2018-06-01 DIAGNOSIS — H2512 Age-related nuclear cataract, left eye: Secondary | ICD-10-CM | POA: Diagnosis not present

## 2018-06-01 DIAGNOSIS — H2513 Age-related nuclear cataract, bilateral: Secondary | ICD-10-CM | POA: Diagnosis not present

## 2018-06-01 DIAGNOSIS — H35033 Hypertensive retinopathy, bilateral: Secondary | ICD-10-CM | POA: Diagnosis not present

## 2018-06-01 DIAGNOSIS — H25013 Cortical age-related cataract, bilateral: Secondary | ICD-10-CM | POA: Diagnosis not present

## 2018-07-07 DIAGNOSIS — H25043 Posterior subcapsular polar age-related cataract, bilateral: Secondary | ICD-10-CM | POA: Diagnosis not present

## 2018-07-07 DIAGNOSIS — H268 Other specified cataract: Secondary | ICD-10-CM | POA: Diagnosis not present

## 2018-07-07 DIAGNOSIS — H25013 Cortical age-related cataract, bilateral: Secondary | ICD-10-CM | POA: Diagnosis not present

## 2018-07-07 DIAGNOSIS — H2513 Age-related nuclear cataract, bilateral: Secondary | ICD-10-CM | POA: Diagnosis not present

## 2018-07-14 DIAGNOSIS — H2512 Age-related nuclear cataract, left eye: Secondary | ICD-10-CM | POA: Diagnosis not present

## 2018-07-15 ENCOUNTER — Other Ambulatory Visit: Payer: Self-pay | Admitting: Family Medicine

## 2018-08-06 DIAGNOSIS — H2511 Age-related nuclear cataract, right eye: Secondary | ICD-10-CM | POA: Diagnosis not present

## 2018-08-06 DIAGNOSIS — H25011 Cortical age-related cataract, right eye: Secondary | ICD-10-CM | POA: Diagnosis not present

## 2018-08-06 DIAGNOSIS — H25041 Posterior subcapsular polar age-related cataract, right eye: Secondary | ICD-10-CM | POA: Diagnosis not present

## 2018-08-18 DIAGNOSIS — H25011 Cortical age-related cataract, right eye: Secondary | ICD-10-CM | POA: Diagnosis not present

## 2018-08-18 DIAGNOSIS — H25041 Posterior subcapsular polar age-related cataract, right eye: Secondary | ICD-10-CM | POA: Diagnosis not present

## 2018-08-18 DIAGNOSIS — H2511 Age-related nuclear cataract, right eye: Secondary | ICD-10-CM | POA: Diagnosis not present

## 2018-08-18 DIAGNOSIS — H25811 Combined forms of age-related cataract, right eye: Secondary | ICD-10-CM | POA: Diagnosis not present

## 2018-08-25 DIAGNOSIS — H2511 Age-related nuclear cataract, right eye: Secondary | ICD-10-CM | POA: Diagnosis not present

## 2018-10-22 DIAGNOSIS — L821 Other seborrheic keratosis: Secondary | ICD-10-CM | POA: Diagnosis not present

## 2018-10-22 DIAGNOSIS — D485 Neoplasm of uncertain behavior of skin: Secondary | ICD-10-CM | POA: Diagnosis not present

## 2018-10-22 DIAGNOSIS — D045 Carcinoma in situ of skin of trunk: Secondary | ICD-10-CM | POA: Diagnosis not present

## 2018-10-22 DIAGNOSIS — L570 Actinic keratosis: Secondary | ICD-10-CM | POA: Diagnosis not present

## 2018-10-22 DIAGNOSIS — Z85828 Personal history of other malignant neoplasm of skin: Secondary | ICD-10-CM | POA: Diagnosis not present

## 2018-10-22 DIAGNOSIS — D225 Melanocytic nevi of trunk: Secondary | ICD-10-CM | POA: Diagnosis not present

## 2018-11-23 ENCOUNTER — Other Ambulatory Visit: Payer: Self-pay

## 2018-11-23 ENCOUNTER — Encounter: Payer: Self-pay | Admitting: Family Medicine

## 2018-11-23 ENCOUNTER — Ambulatory Visit (INDEPENDENT_AMBULATORY_CARE_PROVIDER_SITE_OTHER): Payer: Medicare HMO | Admitting: Family Medicine

## 2018-11-23 VITALS — BP 172/70 | HR 42 | Temp 98.0°F | Ht 74.0 in | Wt 182.4 lb

## 2018-11-23 DIAGNOSIS — N138 Other obstructive and reflux uropathy: Secondary | ICD-10-CM

## 2018-11-23 DIAGNOSIS — Z23 Encounter for immunization: Secondary | ICD-10-CM | POA: Diagnosis not present

## 2018-11-23 DIAGNOSIS — I10 Essential (primary) hypertension: Secondary | ICD-10-CM

## 2018-11-23 DIAGNOSIS — E782 Mixed hyperlipidemia: Secondary | ICD-10-CM

## 2018-11-23 DIAGNOSIS — E039 Hypothyroidism, unspecified: Secondary | ICD-10-CM

## 2018-11-23 DIAGNOSIS — N401 Enlarged prostate with lower urinary tract symptoms: Secondary | ICD-10-CM

## 2018-11-23 DIAGNOSIS — K219 Gastro-esophageal reflux disease without esophagitis: Secondary | ICD-10-CM | POA: Diagnosis not present

## 2018-11-23 LAB — CBC WITH DIFFERENTIAL/PLATELET
Basophils Absolute: 0 10*3/uL (ref 0.0–0.1)
Basophils Relative: 0.3 % (ref 0.0–3.0)
Eosinophils Absolute: 0.3 10*3/uL (ref 0.0–0.7)
Eosinophils Relative: 4.9 % (ref 0.0–5.0)
HCT: 47 % (ref 39.0–52.0)
Hemoglobin: 16 g/dL (ref 13.0–17.0)
Lymphocytes Relative: 17.3 % (ref 12.0–46.0)
Lymphs Abs: 1.1 10*3/uL (ref 0.7–4.0)
MCHC: 34 g/dL (ref 30.0–36.0)
MCV: 89.3 fl (ref 78.0–100.0)
Monocytes Absolute: 0.7 10*3/uL (ref 0.1–1.0)
Monocytes Relative: 10.1 % (ref 3.0–12.0)
Neutro Abs: 4.4 10*3/uL (ref 1.4–7.7)
Neutrophils Relative %: 67.4 % (ref 43.0–77.0)
Platelets: 123 10*3/uL — ABNORMAL LOW (ref 150.0–400.0)
RBC: 5.27 Mil/uL (ref 4.22–5.81)
RDW: 14.4 % (ref 11.5–15.5)
WBC: 6.5 10*3/uL (ref 4.0–10.5)

## 2018-11-23 LAB — HEPATIC FUNCTION PANEL
ALT: 16 U/L (ref 0–53)
AST: 15 U/L (ref 0–37)
Albumin: 4.2 g/dL (ref 3.5–5.2)
Alkaline Phosphatase: 71 U/L (ref 39–117)
Bilirubin, Direct: 0.2 mg/dL (ref 0.0–0.3)
Total Bilirubin: 1 mg/dL (ref 0.2–1.2)
Total Protein: 7 g/dL (ref 6.0–8.3)

## 2018-11-23 LAB — POC URINALSYSI DIPSTICK (AUTOMATED)
Bilirubin, UA: NEGATIVE
Blood, UA: NEGATIVE
Glucose, UA: NEGATIVE
Ketones, UA: NEGATIVE
Leukocytes, UA: NEGATIVE
Nitrite, UA: NEGATIVE
Protein, UA: NEGATIVE
Spec Grav, UA: 1.02 (ref 1.010–1.025)
Urobilinogen, UA: 0.2 E.U./dL
pH, UA: 5.5 (ref 5.0–8.0)

## 2018-11-23 LAB — LIPID PANEL
Cholesterol: 157 mg/dL (ref 0–200)
HDL: 53.8 mg/dL (ref >39.00)
LDL Cholesterol: 81 mg/dL (ref 0–99)
NonHDL: 103.16
Total CHOL/HDL Ratio: 3
Triglycerides: 112 mg/dL (ref 0.0–149.0)
VLDL: 22.4 mg/dL (ref 0.0–40.0)

## 2018-11-23 LAB — BASIC METABOLIC PANEL
BUN: 11 mg/dL (ref 6–23)
CO2: 29 mEq/L (ref 19–32)
Calcium: 9.3 mg/dL (ref 8.4–10.5)
Chloride: 104 mEq/L (ref 96–112)
Creatinine, Ser: 1.02 mg/dL (ref 0.40–1.50)
GFR: 71.69 mL/min (ref >60.00)
Glucose, Bld: 98 mg/dL (ref 70–99)
Potassium: 4.8 mEq/L (ref 3.5–5.1)
Sodium: 139 mEq/L (ref 135–145)

## 2018-11-23 LAB — TSH: TSH: 0.25 u[IU]/mL — ABNORMAL LOW (ref 0.35–4.50)

## 2018-11-23 LAB — PSA: PSA: 0.98 ng/mL (ref 0.10–4.00)

## 2018-11-23 MED ORDER — OMEPRAZOLE 20 MG PO CPDR: 20.0000 mg | DELAYED_RELEASE_CAPSULE | ORAL | 3 refills | Status: DC

## 2018-11-23 MED ORDER — BENAZEPRIL HCL 20 MG PO TABS: 20.0000 mg | ORAL_TABLET | Freq: Every day | ORAL | 3 refills | Status: DC

## 2018-11-23 MED ORDER — METOPROLOL SUCCINATE ER 50 MG PO TB24: ORAL_TABLET | ORAL | 3 refills | Status: DC

## 2018-11-23 MED ORDER — LEVOTHYROXINE SODIUM 150 MCG PO TABS: ORAL_TABLET | ORAL | 3 refills | Status: DC

## 2018-11-23 NOTE — Progress Notes (Signed)
Subjective:    Patient ID: Sean Mcgee, male    DOB: 05/27/1945, 73 y.o.   MRN: [id number]  HPI Here to follow up on issues. He feels fine. His BP at home has been creeping up however. His systolic readings have been in the 150s and 160s with stable diastolic readings in the [id number]. His weight is stable. He watches his diet closely.    Review of Systems  Constitutional: Negative.   HENT: Negative.   Eyes: Negative.   Respiratory: Negative.   Cardiovascular: Negative.   Gastrointestinal: Negative.   Genitourinary: Negative.   Musculoskeletal: Negative.   Skin: Negative.   Neurological: Negative.   Psychiatric/Behavioral: Negative.        Objective:   Physical Exam Constitutional:      General: He is not in acute distress.    Appearance: He is well-developed. He is not diaphoretic.  HENT:     Head: Normocephalic and atraumatic.     Right Ear: External ear normal.     Left Ear: External ear normal.     Nose: Nose normal.     Mouth/Throat:     Pharynx: No oropharyngeal exudate.  Eyes:     General: No scleral icterus.       Right eye: No discharge.        Left eye: No discharge.     Conjunctiva/sclera: Conjunctivae normal.     Pupils: Pupils are equal, round, and reactive to light.  Neck:     Musculoskeletal: Neck supple.     Thyroid: No thyromegaly.     Vascular: No JVD.     Trachea: No tracheal deviation.  Cardiovascular:     Rate and Rhythm: Normal rate and regular rhythm.     Heart sounds: Normal heart sounds. No murmur. No friction rub. No gallop.   Pulmonary:     Effort: Pulmonary effort is normal. No respiratory distress.     Breath sounds: Normal breath sounds. No wheezing or rales.  Chest:     Chest wall: No tenderness.  Abdominal:     General: Bowel sounds are normal. There is no distension.     Palpations: Abdomen is soft. There is no mass.     Tenderness: There is no abdominal tenderness. There is no guarding or rebound.     Comments: He has his  usual small umbilical hernia which is non-tender and easily reducible   Genitourinary:    Penis: Normal. No tenderness.      Scrotum/Testes: Normal.     Prostate: Normal.     Rectum: Normal. Guaiac result negative.  Musculoskeletal: Normal range of motion.        General: No tenderness.  Lymphadenopathy:     Cervical: No cervical adenopathy.  Skin:    General: Skin is warm and dry.     Coloration: Skin is not pale.     Findings: No erythema or rash.  Neurological:     Mental Status: He is alert and oriented to person, place, and time.     Cranial Nerves: No cranial nerve deficit.     Motor: No abnormal muscle tone.     Coordination: Coordination normal.     Deep Tendon Reflexes: Reflexes are normal and symmetric. Reflexes normal.  Psychiatric:        Behavior: Behavior normal.        Thought Content: Thought content normal.        Judgment: Judgment normal.  Assessment & Plan:  His BP has been a bit high, so we will increase the Benazepril to 20 mg daily. Get fasting labs to check lipids, etc. His GERD is stable.  Alysia Penna, MD

## 2018-12-30 DIAGNOSIS — D045 Carcinoma in situ of skin of trunk: Secondary | ICD-10-CM | POA: Diagnosis not present

## 2019-04-07 DIAGNOSIS — H26493 Other secondary cataract, bilateral: Secondary | ICD-10-CM | POA: Diagnosis not present

## 2019-05-03 ENCOUNTER — Telehealth: Payer: Self-pay | Admitting: Family Medicine

## 2019-05-03 NOTE — Telephone Encounter (Signed)
Please advise 

## 2019-05-03 NOTE — Telephone Encounter (Signed)
[name] spoke with patient and the patient stated that he is only taking his:   simvastatin (ZOCOR) 40 MG tablet   once every other day instead of once a day.  [name] wanted to make you aware of this for future refills. If Dr. Sarajane Jews is okay with the patient only taking once every other day then it needs to be changed on the instructions for the Rx because the patient is not getting his Rx refilled every 30 days because he's not talking the Rx as prescribed.   If you have any questions you can contact: Sean Mcgee [phone]

## 2019-05-04 MED ORDER — SIMVASTATIN 40 MG PO TABS: ORAL_TABLET | ORAL | 11 refills | Status: DC

## 2019-05-04 NOTE — Addendum Note (Signed)
Addended by: Alysia Penna A on: 05/04/2019 08:04 AM   Modules accepted: Orders

## 2019-05-04 NOTE — Telephone Encounter (Signed)
Done

## 2019-10-07 ENCOUNTER — Other Ambulatory Visit: Payer: Self-pay | Admitting: Family Medicine

## 2019-10-08 ENCOUNTER — Telehealth: Payer: Self-pay | Admitting: Family Medicine

## 2019-10-08 NOTE — Telephone Encounter (Signed)
Please advise. Ok to change the Rx for simvastatin?

## 2019-10-08 NOTE — Telephone Encounter (Signed)
Pt called the pharmacy and said he takes medication  simvastatin (ZOCOR) 40 MG tablet    Every other day now and she needs a new prescription written for that order   Also she needs a 90 supply order for   metoprolol succinate (TOPROL-XL) 50 MG 24 hr tablet     Please call [place] [phone]

## 2019-10-11 MED ORDER — SIMVASTATIN 40 MG PO TABS: ORAL_TABLET | ORAL | 3 refills | Status: DC

## 2019-10-11 MED ORDER — METOPROLOL SUCCINATE ER 50 MG PO TB24: ORAL_TABLET | ORAL | 3 refills | Status: DC

## 2019-10-11 NOTE — Telephone Encounter (Signed)
Prescriptions have been sent in. Left a detailed message on verified voice mail informing the patient that his medications have been sent in.

## 2019-10-11 NOTE — Telephone Encounter (Signed)
It is OK to take Simvastatin every other day. Please refill both meds for one year

## 2019-11-09 DIAGNOSIS — L570 Actinic keratosis: Secondary | ICD-10-CM | POA: Diagnosis not present

## 2019-11-09 DIAGNOSIS — D225 Melanocytic nevi of trunk: Secondary | ICD-10-CM | POA: Diagnosis not present

## 2019-11-09 DIAGNOSIS — Z85828 Personal history of other malignant neoplasm of skin: Secondary | ICD-10-CM | POA: Diagnosis not present

## 2019-11-09 DIAGNOSIS — L821 Other seborrheic keratosis: Secondary | ICD-10-CM | POA: Diagnosis not present

## 2019-11-09 DIAGNOSIS — D485 Neoplasm of uncertain behavior of skin: Secondary | ICD-10-CM | POA: Diagnosis not present

## 2019-11-09 DIAGNOSIS — L578 Other skin changes due to chronic exposure to nonionizing radiation: Secondary | ICD-10-CM | POA: Diagnosis not present

## 2019-11-09 DIAGNOSIS — L820 Inflamed seborrheic keratosis: Secondary | ICD-10-CM | POA: Diagnosis not present

## 2019-11-09 DIAGNOSIS — C44519 Basal cell carcinoma of skin of other part of trunk: Secondary | ICD-10-CM | POA: Diagnosis not present

## 2019-11-23 ENCOUNTER — Other Ambulatory Visit: Payer: Self-pay

## 2019-11-24 ENCOUNTER — Encounter: Payer: Self-pay | Admitting: Family Medicine

## 2019-11-24 ENCOUNTER — Ambulatory Visit (INDEPENDENT_AMBULATORY_CARE_PROVIDER_SITE_OTHER): Payer: Medicare HMO | Admitting: Family Medicine

## 2019-11-24 VITALS — BP 148/84 | HR 61 | Temp 98.1°F | Ht 74.0 in | Wt 182.8 lb

## 2019-11-24 DIAGNOSIS — Z23 Encounter for immunization: Secondary | ICD-10-CM

## 2019-11-24 DIAGNOSIS — Z0000 Encounter for general adult medical examination without abnormal findings: Secondary | ICD-10-CM | POA: Diagnosis not present

## 2019-11-24 NOTE — Progress Notes (Signed)
Subjective:    Patient ID: Sean Mcgee, male    DOB: [birth year], 74 y.o.   MRN: [id number]  HPI Here for a well exam. He has been doing well. He has had some pains in his hips and knees, and he wondered if these could be side effects of his Zocor. He stopped taking this 2 weeks ago and so far he has not noticed much difference. His BP at home averages in the 130s over 60s. He was due for another colonoscopy in April, but he postponed this due to a family trip. He never called the GI office back however.   Review of Systems  Constitutional: Negative.   HENT: Negative.   Eyes: Negative.   Respiratory: Negative.   Cardiovascular: Negative.   Gastrointestinal: Negative.   Genitourinary: Negative.   Musculoskeletal: Positive for arthralgias.  Skin: Negative.   Neurological: Negative.   Psychiatric/Behavioral: Negative.        Objective:   Physical Exam Constitutional:      General: He is not in acute distress.    Appearance: He is well-developed. He is not diaphoretic.  HENT:     Head: Normocephalic and atraumatic.     Right Ear: External ear normal.     Left Ear: External ear normal.     Nose: Nose normal.     Mouth/Throat:     Pharynx: No oropharyngeal exudate.  Eyes:     General: No scleral icterus.       Right eye: No discharge.        Left eye: No discharge.     Conjunctiva/sclera: Conjunctivae normal.     Pupils: Pupils are equal, round, and reactive to light.  Neck:     Thyroid: No thyromegaly.     Vascular: No JVD.     Trachea: No tracheal deviation.  Cardiovascular:     Rate and Rhythm: Normal rate and regular rhythm.     Heart sounds: Normal heart sounds. No murmur heard.  No friction rub. No gallop.   Pulmonary:     Effort: Pulmonary effort is normal. No respiratory distress.     Breath sounds: Normal breath sounds. No wheezing or rales.  Chest:     Chest wall: No tenderness.  Abdominal:     General: Bowel sounds are normal. There is no distension.      Palpations: Abdomen is soft. There is no mass.     Tenderness: There is no abdominal tenderness. There is no guarding or rebound.     Comments: Small non-tender easily reducible umbilical hernia   Genitourinary:    Penis: Normal. No tenderness.      Testes: Normal.     Prostate: Normal.     Rectum: Normal. Guaiac result negative.  Musculoskeletal:        General: No tenderness. Normal range of motion.     Cervical back: Neck supple.  Lymphadenopathy:     Cervical: No cervical adenopathy.  Skin:    General: Skin is warm and dry.     Coloration: Skin is not pale.     Findings: No erythema or rash.  Neurological:     Mental Status: He is alert and oriented to person, place, and time.     Cranial Nerves: No cranial nerve deficit.     Motor: No abnormal muscle tone.     Coordination: Coordination normal.     Deep Tendon Reflexes: Reflexes are normal and symmetric. Reflexes normal.  Psychiatric:  Behavior: Behavior normal.        Thought Content: Thought content normal.        Judgment: Judgment normal.           Assessment & Plan:  Well exam. We discussed diet and exercise. Get fasting labs. I told him he can stay off Zocor for another 4 weeks to see if his joints feel better. He will let me know what happens. I asked him to contact Dr. Havery Moros to set up the colonoscopy. Alysia Penna, MD

## 2019-11-24 NOTE — Progress Notes (Signed)
[id number]

## 2019-11-25 LAB — LIPID PANEL
Cholesterol: 210 mg/dL — ABNORMAL HIGH (ref <200)
HDL: 55 mg/dL (ref >=40)
LDL Cholesterol (Calc): 133 mg/dL (calc) — ABNORMAL HIGH
Non-HDL Cholesterol (Calc): 155 mg/dL (calc) — ABNORMAL HIGH (ref <130)
Total CHOL/HDL Ratio: 3.8 (calc) (ref <5.0)
Triglycerides: 116 mg/dL (ref <150)

## 2019-11-25 LAB — HEPATIC FUNCTION PANEL
AG Ratio: 1.5 (calc) (ref 1.0–2.5)
ALT: 18 U/L (ref 9–46)
AST: 15 U/L (ref 10–35)
Albumin: 4.4 g/dL (ref 3.6–5.1)
Alkaline phosphatase (APISO): 66 U/L (ref 35–144)
Bilirubin, Direct: 0.2 mg/dL (ref 0.0–0.2)
Globulin: 2.9 g/dL (calc) (ref 1.9–3.7)
Indirect Bilirubin: 0.8 mg/dL (calc) (ref 0.2–1.2)
Total Bilirubin: 1 mg/dL (ref 0.2–1.2)
Total Protein: 7.3 g/dL (ref 6.1–8.1)

## 2019-11-25 LAB — PSA: PSA: 0.79 ng/mL (ref <=4.0)

## 2019-11-25 LAB — CBC WITH DIFFERENTIAL/PLATELET
Absolute Monocytes: 456 cells/uL (ref 200–950)
Basophils Absolute: 21 cells/uL (ref 0–200)
Basophils Relative: 0.4 %
Eosinophils Absolute: 207 cells/uL (ref 15–500)
Eosinophils Relative: 3.9 %
HCT: 52.1 % — ABNORMAL HIGH (ref 38.5–50.0)
Hemoglobin: 17.4 g/dL — ABNORMAL HIGH (ref 13.2–17.1)
Lymphs Abs: 1092 cells/uL (ref 850–3900)
MCH: 29.7 pg (ref 27.0–33.0)
MCHC: 33.4 g/dL (ref 32.0–36.0)
MCV: 88.9 fL (ref 80.0–100.0)
MPV: 11.5 fL (ref 7.5–12.5)
Monocytes Relative: 8.6 %
Neutro Abs: 3525 cells/uL (ref 1500–7800)
Neutrophils Relative %: 66.5 %
Platelets: 132 10*3/uL — ABNORMAL LOW (ref 140–400)
RBC: 5.86 10*6/uL — ABNORMAL HIGH (ref 4.20–5.80)
RDW: 12.8 % (ref 11.0–15.0)
Total Lymphocyte: 20.6 %
WBC: 5.3 10*3/uL (ref 3.8–10.8)

## 2019-11-25 LAB — BASIC METABOLIC PANEL
BUN: 12 mg/dL (ref 7–25)
CO2: 29 mmol/L (ref 20–32)
Calcium: 9.3 mg/dL (ref 8.6–10.3)
Chloride: 104 mmol/L (ref 98–110)
Creat: 1.05 mg/dL (ref 0.70–1.18)
Glucose, Bld: 93 mg/dL (ref 65–99)
Potassium: 4.4 mmol/L (ref 3.5–5.3)
Sodium: 139 mmol/L (ref 135–146)

## 2019-11-25 LAB — TSH: TSH: 0.46 mIU/L (ref 0.40–4.50)

## 2019-12-20 ENCOUNTER — Encounter: Payer: Self-pay | Admitting: Gastroenterology

## 2019-12-21 DIAGNOSIS — L821 Other seborrheic keratosis: Secondary | ICD-10-CM | POA: Diagnosis not present

## 2019-12-21 DIAGNOSIS — C44519 Basal cell carcinoma of skin of other part of trunk: Secondary | ICD-10-CM | POA: Diagnosis not present

## 2020-01-28 ENCOUNTER — Telehealth: Payer: Self-pay | Admitting: Family Medicine

## 2020-01-28 NOTE — Telephone Encounter (Signed)
Left message for patient to call back and schedule Medicare Annual Wellness Visit (AWV) either virtually or in office.   Last AWV no information  please schedule at anytime with [facility] Nurse Health Advisor 1 or 2   This should be a 45 minute visit.

## 2020-02-07 ENCOUNTER — Ambulatory Visit (AMBULATORY_SURGERY_CENTER): Payer: Self-pay | Admitting: *Deleted

## 2020-02-07 ENCOUNTER — Other Ambulatory Visit: Payer: Self-pay

## 2020-02-07 VITALS — Ht 74.0 in | Wt 185.0 lb

## 2020-02-07 DIAGNOSIS — Z860100 Personal history of colon polyps, unspecified: Secondary | ICD-10-CM

## 2020-02-07 MED ORDER — PLENVU 140 G PO SOLR: 1.0000 | Freq: Once | ORAL | 0 refills | Status: AC

## 2020-02-07 NOTE — Progress Notes (Signed)
Patient is here in-person for PV. Patient denies any allergies to eggs or soy. Patient denies any problems with anesthesia/sedation. Patient denies any oxygen use at home. Patient denies taking any diet/weight loss medications or blood thinners. Patient is not being treated for MRSA or C-diff. Patient is aware of our care-partner policy and Covid-19 safety protocol.  COVID-19 vaccines completed on 11/2019 booster, per patient.   Prep Prescription coupon given to the patient. 

## 2020-02-17 ENCOUNTER — Encounter: Payer: Self-pay | Admitting: Gastroenterology

## 2020-02-21 ENCOUNTER — Other Ambulatory Visit: Payer: Self-pay | Admitting: Family Medicine

## 2020-02-23 ENCOUNTER — Other Ambulatory Visit: Payer: Self-pay

## 2020-02-23 ENCOUNTER — Encounter: Payer: Self-pay | Admitting: Gastroenterology

## 2020-02-23 ENCOUNTER — Other Ambulatory Visit: Payer: Self-pay | Admitting: Gastroenterology

## 2020-02-23 ENCOUNTER — Ambulatory Visit (AMBULATORY_SURGERY_CENTER): Payer: Medicare HMO | Admitting: Gastroenterology

## 2020-02-23 VITALS — BP 122/57 | HR 49 | Temp 97.0°F | Resp 15 | Ht 74.0 in | Wt 185.0 lb

## 2020-02-23 DIAGNOSIS — D122 Benign neoplasm of ascending colon: Secondary | ICD-10-CM

## 2020-02-23 DIAGNOSIS — Z86010 Personal history of colonic polyps: Secondary | ICD-10-CM | POA: Diagnosis not present

## 2020-02-23 DIAGNOSIS — D123 Benign neoplasm of transverse colon: Secondary | ICD-10-CM

## 2020-02-23 HISTORY — PX: COLONOSCOPY: SHX174

## 2020-02-23 MED ORDER — SODIUM CHLORIDE 0.9 % IV SOLN: 500.0000 mL | Freq: Once | INTRAVENOUS | Status: DC

## 2020-02-23 NOTE — Patient Instructions (Signed)
Thank you for allowing [place] to care for you today!  Await final results of polyps removed, approximately 7-10 days by mail.  Will make recommendation for future colonoscopy at that time.  Resume previous diet and medications today.  Return to your normal activities tomorrow.   YOU HAD AN ENDOSCOPIC PROCEDURE TODAY AT [facility]:   Refer to the procedure report that was given to you for any specific questions about what was found during the examination.  If the procedure report does not answer your questions, please call your gastroenterologist to clarify.  If you requested that your care partner not be given the details of your procedure findings, then the procedure report has been included in a sealed envelope for you to review at your convenience later.  YOU SHOULD EXPECT: Some feelings of bloating in the abdomen. Passage of more gas than usual.  Walking can help get rid of the air that was put into your GI tract during the procedure and reduce the bloating. If you had a lower endoscopy (such as a colonoscopy or flexible sigmoidoscopy) you may notice spotting of blood in your stool or on the toilet paper. If you underwent a bowel prep for your procedure, you may not have a normal bowel movement for a few days.  Please Note:  You might notice some irritation and congestion in your nose or some drainage.  This is from the oxygen used during your procedure.  There is no need for concern and it should clear up in a day or so.  SYMPTOMS TO REPORT IMMEDIATELY:   Following lower endoscopy (colonoscopy or flexible sigmoidoscopy):  Excessive amounts of blood in the stool  Significant tenderness or worsening of abdominal pains  Swelling of the abdomen that is new, acute  Fever of 100F or higher   For urgent or emergent issues, a gastroenterologist can be reached at any hour by calling [phone]. Do not use MyChart messaging for urgent concerns.    DIET:  We do recommend a  small meal at first, but then you may proceed to your regular diet.  Drink plenty of fluids but you should avoid alcoholic beverages for 24 hours.  ACTIVITY:  You should plan to take it easy for the rest of today and you should NOT DRIVE or use heavy machinery until tomorrow (because of the sedation medicines used during the test).    FOLLOW UP: Our staff will call the number listed on your records 48-72 hours following your procedure to check on you and address any questions or concerns that you may have regarding the information given to you following your procedure. If we do not reach you, we will leave a message.  We will attempt to reach you two times.  During this call, we will ask if you have developed any symptoms of COVID 19. If you develop any symptoms (ie: fever, flu-like symptoms, shortness of breath, cough etc.) before then, please call [phone].  If you test positive for Covid 19 in the 2 weeks post procedure, please call and report this information to [place].    If any biopsies were taken you will be contacted by phone or by letter within the next 1-3 weeks.  Please call us at [phone] if you have not heard about the biopsies in 3 weeks.    SIGNATURES/CONFIDENTIALITY: You and/or your care partner have signed paperwork which will be entered into your electronic medical record.  These signatures attest to the fact that that  the information above on your After Visit Summary has been reviewed and is understood.  Full responsibility of the confidentiality of this discharge information lies with you and/or your care-partner.

## 2020-02-23 NOTE — Op Note (Signed)
[place] Patient Name: Sean Mcgee Procedure Date: 02/23/2020 10:38 AM MRN: [id number] Endoscopist: Remo Lipps P. Havery Moros , MD Age: 75 Referring MD:  Date of Birth: Mar 09, 1945 Gender: Male Account #: [id number] Procedure:                Colonoscopy Indications:              High risk colon cancer surveillance: Personal                            history of colonic polyps (5 polyps removed in 2018                            - adenoma and sessile serrated Medicines:                Monitored Anesthesia Care Procedure:                Pre-Anesthesia Assessment:                           - Prior to the procedure, a History and Physical                            was performed, and patient medications and                            allergies were reviewed. The patient's tolerance of                            previous anesthesia was also reviewed. The risks                            and benefits of the procedure and the sedation                            options and risks were discussed with the patient.                            All questions were answered, and informed consent                            was obtained. Prior Anticoagulants: The patient has                            taken no previous anticoagulant or antiplatelet                            agents. ASA Grade Assessment: II - A patient with                            mild systemic disease. After reviewing the risks                            and benefits, the patient was deemed in  satisfactory condition to undergo the procedure.                           After obtaining informed consent, the colonoscope                            was passed under direct vision. Throughout the                            procedure, the patient's blood pressure, pulse, and                            oxygen saturations were monitored continuously. The                            Olympus CF-HQ190 [id number] [id number])  Colonoscope was                            introduced through the anus and advanced to the the                            cecum, identified by appendiceal orifice and                            ileocecal valve. The colonoscopy was performed                            without difficulty. The patient tolerated the                            procedure well. The quality of the bowel                            preparation was good. The ileocecal valve,                            appendiceal orifice, and rectum were photographed. Scope In: 10:58:11 AM Scope Out: 11:14:50 AM Scope Withdrawal Time: 0 hours 14 minutes 40 seconds  Total Procedure Duration: 0 hours 16 minutes 39 seconds  Findings:                 The perianal and digital rectal examinations were                            normal.                           Three flat polyps were found in the ascending                            colon. The polyps were 2 to 5 mm in size. These                            polyps were removed with a cold snare. Resection  and retrieval were complete.                           A 3 mm polyp was found in the hepatic flexure. The                            polyp was sessile. The polyp was removed with a                            cold snare. Resection and retrieval were complete.                           Multiple medium-mouthed diverticula were found in                            the sigmoid colon.                           Internal hemorrhoids were found during retroflexion.                           The exam was otherwise without abnormality. Complications:            No immediate complications. Estimated blood loss:                            Minimal. Estimated Blood Loss:     Estimated blood loss was minimal. Impression:               - Three 2 to 5 mm polyps in the ascending colon,                            removed with a cold snare. Resected and retrieved.                            - One 3 mm polyp at the hepatic flexure, removed                            with a cold snare. Resected and retrieved.                           - Diverticulosis in the sigmoid colon.                           - Internal hemorrhoids.                           - The examination was otherwise normal. Recommendation:           - Patient has a contact number available for                            emergencies. The signs and symptoms of potential  delayed complications were discussed with the                            patient. Return to normal activities tomorrow.                            Written discharge instructions were provided to the                            patient.                           - Resume previous diet.                           - Continue present medications.                           - Await pathology results. Remo Lipps P. Susette Seminara, MD 02/23/2020 11:19:12 AM This report has been signed electronically.

## 2020-02-23 NOTE — Progress Notes (Signed)
Vitals-CW  Pt's states no medical or surgical changes since previsit or office visit. 

## 2020-02-23 NOTE — Progress Notes (Signed)
Called to room to assist during endoscopic procedure.  Patient ID and intended procedure confirmed with present staff. Received instructions for my participation in the procedure from the performing physician.  

## 2020-02-23 NOTE — Progress Notes (Signed)
Report given to PACU, vss 

## 2020-02-25 ENCOUNTER — Telehealth: Payer: Self-pay

## 2020-02-25 NOTE — Telephone Encounter (Signed)
  Follow up Call-  Call back number 02/23/2020  Post procedure Call Back phone  # [phone]  Permission to leave phone message Yes  Some recent data might be hidden     Patient questions:  Do you have a fever, pain , or abdominal swelling? No. Pain Score  0 *  Have you tolerated food without any problems? Yes.    Have you been able to return to your normal activities? Yes.    Do you have any questions about your discharge instructions: Diet   No. Medications  No. Follow up visit  No.  Do you have questions or concerns about your Care? No.  Actions: * If pain score is 4 or above: 1. No action needed, pain <4.Have you developed a fever since your procedure? no  2.   Have you had an respiratory symptoms (SOB or cough) since your procedure? no  3.   Have you tested positive for COVID 19 since your procedure no  4.   Have you had any family members/close contacts diagnosed with the COVID 19 since your procedure?  no   If yes to any of these questions please route to Joylene John, RN and Joella Prince, RN

## 2020-04-05 ENCOUNTER — Telehealth: Payer: Self-pay | Admitting: Family Medicine

## 2020-04-05 NOTE — Telephone Encounter (Signed)
Tried calling patient to  schedule Medicare Annual Wellness Visit (AWV) either virtually or in office.  No answer   AWVI  please schedule at anytime with [facility] Nurse Health Advisor 1 or 2   This should be a 45 minute visit.

## 2020-04-10 DIAGNOSIS — H26493 Other secondary cataract, bilateral: Secondary | ICD-10-CM | POA: Diagnosis not present

## 2020-05-27 ENCOUNTER — Other Ambulatory Visit: Payer: Self-pay | Admitting: Family Medicine

## 2020-07-12 ENCOUNTER — Telehealth: Payer: Self-pay | Admitting: Family Medicine

## 2020-07-12 NOTE — Telephone Encounter (Signed)
Left message for patient to call back and schedule Medicare Annual Wellness Visit (AWV) either virtually or in office.   AWV-I per [place] 08/11/13  please schedule at anytime with [facility] Nurse Health Advisor 1 or 2   This should be a 45 minute visit.

## 2020-07-13 NOTE — Telephone Encounter (Signed)
Patient will call back to schedule ?

## 2020-08-11 ENCOUNTER — Telehealth: Payer: Self-pay | Admitting: Family Medicine

## 2020-08-11 NOTE — Telephone Encounter (Signed)
Left message for patient to call back and schedule Medicare Annual Wellness Visit (AWV) either virtually or in office.    AWV-I per [place] 08/11/13  please schedule at anytime with [facility] Nurse Health Advisor 1 or 2   This should be a 45 minute visit.

## 2020-08-25 ENCOUNTER — Other Ambulatory Visit: Payer: Self-pay | Admitting: Family Medicine

## 2020-09-18 ENCOUNTER — Ambulatory Visit (INDEPENDENT_AMBULATORY_CARE_PROVIDER_SITE_OTHER): Payer: Medicare HMO

## 2020-09-18 DIAGNOSIS — Z0000 Encounter for general adult medical examination without abnormal findings: Secondary | ICD-10-CM | POA: Diagnosis not present

## 2020-09-18 NOTE — Progress Notes (Signed)
Subjective:   Sean Mcgee is a 75 y.o. male who presents for an Initial Medicare Annual Wellness Visit.   I connected with Sean Mcgee today by telephone and verified that I am speaking with the correct person using two identifiers. Location patient: home Location provider: work Persons participating in the virtual visit: patient, provider.   I discussed the limitations, risks, security and privacy concerns of performing an evaluation and management service by telephone and the availability of in person appointments. I also discussed with the patient that there may be a patient responsible charge related to this service. The patient expressed understanding and verbally consented to this telephonic visit.    Interactive audio and video telecommunications were attempted between this provider and patient, however failed, due to patient having technical difficulties OR patient did not have access to video capability.  We continued and completed visit with audio only.     Review of Systems    N/a       Objective:    There were no vitals filed for this visit. There is no height or weight on file to calculate BMI.  Advanced Directives 05/01/2016  Does Patient Have a Medical Advance Directive? No    Current Medications (verified) Outpatient Encounter Medications as of 09/18/2020  Medication Sig   benazepril (LOTENSIN) 20 MG tablet TAKE 1 TABLET BY MOUTH ONCE A DAY   levothyroxine (SYNTHROID) 150 MCG tablet TAKE 1 TABLET BY MOUTH ONCE A DAY BEFOREBREAKFAST   metoprolol succinate (TOPROL-XL) 50 MG 24 hr tablet TAKE 1 TABLET BY MOUTH ONCE A DAY ** TAKE WITH OR IMMEDIATELY FOLLOWING A MEAL   omeprazole (PRILOSEC) 20 MG capsule Take 1 capsule (20 mg total) by mouth every other day.   simvastatin (ZOCOR) 40 MG tablet TAKE 1 TABLET BY MOUTH EVERY OTHER NIGHT AT BEDTIME   No facility-administered encounter medications on file as of 09/18/2020.    Allergies (verified) Patient has no known  allergies.   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 Havery Moros, adenomatous and sessile serratred polyps, repeat in 3 years    HERNIA REPAIR     inguinal  bilateral   POLYPECTOMY     VASECTOMY     Family History  Problem Relation Age of Onset   Coronary artery disease Other    Cancer Other        abdominal   Stomach cancer Father    Colon cancer Neg Hx    Esophageal cancer Neg Hx    Rectal cancer Neg Hx    Colon polyps Neg Hx    Social History   Socioeconomic History   Marital status: Single    Spouse name: Not on file   Number of children: Not on file   Years of education: Not on file   Highest education level: Not on file  Occupational History   Not on file  Tobacco Use   Smoking status: Never   Smokeless tobacco: Never  Vaping Use   Vaping Use: Never used  Substance and Sexual Activity   Alcohol use: Yes    Alcohol/week: 6.0 standard drinks    Types: 6 Glasses  of wine per week    Comment: glass of wine   Drug use: No   Sexual activity: Not on file  Other Topics Concern   Not on file  Social History Narrative   Not on file   Social Determinants of Health   Financial Resource Strain: Not on file  Food Insecurity: Not on file  Transportation Needs: Not on file  Physical Activity: Not on file  Stress: Not on file  Social Connections: Not on file    Tobacco Counseling Counseling given: Not Answered   Clinical Intake:                 Diabetic?no         Activities of Daily Living No flowsheet data found.  Patient Care Team: Laurey Morale, MD as PCP - General  Indicate any recent Medical Services you may have received from other than [facility]  providers in the past year (date may be approximate).     Assessment:   This is a routine wellness examination for Sean Mcgee.  Hearing/Vision screen No results found.  Dietary issues and exercise activities discussed:     Goals Addressed   None    Depression Screen PHQ 2/9 Scores 11/17/2017 09/12/2015 09/02/2014  PHQ - 2 Score 0 0 0    Fall Risk Fall Risk  11/17/2017 09/12/2015 09/02/2014  Falls in the past year? No No No    FALL RISK PREVENTION PERTAINING TO THE HOME:  Any stairs in or around the home? Yes  If so, are there any without handrails? No  Home free of loose throw rugs in walkways, pet beds, electrical cords, etc? Yes  Adequate lighting in your home to reduce risk of falls? Yes   ASSISTIVE DEVICES UTILIZED TO PREVENT FALLS:  Life alert? No  Use of a cane, walker or w/c? No  Grab bars in the bathroom? No  Shower chair or bench in shower? Yes  Elevated toilet seat or a handicapped toilet? No    Cognitive Function:    Normal cognitive status assessed by direct observation by this Nurse Health Advisor. No abnormalities found.      Immunizations Immunization History  Administered Date(s) Administered   Fluad Quad(high Dose 65+) 11/23/2018, 11/24/2019    TDAP status: Due, Education has been provided regarding the importance of this vaccine. Advised may receive this vaccine at local pharmacy or Health Dept. Aware to provide a copy of the vaccination record if obtained from local pharmacy or Health Dept. Verbalized acceptance and understanding.  Flu Vaccine status: Up to date  Pneumococcal vaccine status: Due, Education has been provided regarding the importance of this vaccine. Advised may receive this vaccine at local pharmacy or Health Dept. Aware to provide a copy of the vaccination record if obtained from local pharmacy or Health Dept. Verbalized acceptance and understanding.  Covid-19 vaccine status: Completed vaccines  Qualifies for Shingles Vaccine? Yes    Zostavax completed No   Shingrix Completed?: No.    Education has been provided regarding the importance of this vaccine. Patient has been advised to call insurance company to determine out of pocket expense if they have not yet received this vaccine. Advised may also receive vaccine at local pharmacy or Health Dept. Verbalized acceptance and understanding.  Screening Tests Health Maintenance  Topic Date Due   COVID-19 Vaccine (1) Never done   Hepatitis C Screening  Never done   TETANUS/TDAP  Never done   Zoster Vaccines- Shingrix (1 of 2) Never done  PNA vac Low Risk Adult (1 of 2 - PCV13) Never done   INFLUENZA VACCINE  09/11/2020   COLONOSCOPY (Pts 45-16yr Insurance coverage will need to be confirmed)  02/22/2025   HPV VACCINES  Aged Out    Health Maintenance  Health Maintenance Due  Topic Date Due   COVID-19 Vaccine (1) Never done   Hepatitis C Screening  Never done   TETANUS/TDAP  Never done   Zoster Vaccines- Shingrix (1 of 2) Never done   PNA vac Low Risk Adult (1 of 2 - PCV13) Never done   INFLUENZA VACCINE  09/11/2020    Colorectal cancer screening: Type of screening: Colonoscopy. Completed 02/23/2020. Repeat every 5 years  Lung Cancer Screening: (Low Dose CT Chest recommended if Age 74-80years, 30 pack-year currently smoking OR have quit w/in 15years.) does not qualify.   Lung Cancer Screening Referral: n/a  Additional Screening:  Hepatitis C Screening: does qualify;  Vision Screening: Recommended annual ophthalmology exams for early detection of glaucoma and other disorders of the eye. Is the patient up to date with their annual eye exam?  Yes  Who is the provider or what is the name of the office in which the patient attends annual eye exams? Dr.Woodard  If pt is not established with a provider, would they like to be referred to a provider to establish care? No .   Dental Screening: Recommended annual dental exams for proper oral hygiene  Community  Resource Referral / Chronic Care Management: CRR required this visit?  No   CCM required this visit?  No      Plan:     I have personally reviewed and noted the following in the patient's chart:   Medical and social history Use of alcohol, tobacco or illicit drugs  Current medications and supplements including opioid prescriptions. Patient is not currently taking opioid prescriptions. Functional ability and status Nutritional status Physical activity Advanced directives List of other physicians Hospitalizations, surgeries, and ER visits in previous 12 months Vitals Screenings to include cognitive, depression, and falls Referrals and appointments  In addition, I have reviewed and discussed with patient certain preventive protocols, quality metrics, and best practice recommendations. A written personalized care plan for preventive services as well as general preventive health recommendations were provided to patient.     LRandel Pigg LPN   [id number]  Nurse Notes: none

## 2020-09-18 NOTE — Patient Instructions (Signed)
[name] , Thank you for taking time to come for your Medicare Wellness Visit. I appreciate your ongoing commitment to your health goals. Please review the following plan we discussed and let me know if I can assist you in the future.   Screening recommendations/referrals: Colonoscopy: 02/23/2020  due 2027 Recommended yearly ophthalmology/optometry visit for glaucoma screening and checkup Recommended yearly dental visit for hygiene and checkup  Vaccinations: Influenza vaccine: due in fall 2022  Pneumococcal vaccine: due  Tdap vaccine: due  Shingles vaccine: will obtain local pharmacy     Advanced directives: none   Conditions/risks identified: none   Next appointment: CPE  12/04/2020  '@930am'$   With Dr.Fry   Preventive Care 5 Years and Older, Male Preventive care refers to lifestyle choices and visits with your health care provider that can promote health and wellness. What does preventive care include? A yearly physical exam. This is also called an annual well check. Dental exams once or twice a year. Routine eye exams. Ask your health care provider how often you should have your eyes checked. Personal lifestyle choices, including: Daily care of your teeth and gums. Regular physical activity. Eating a healthy diet. Avoiding tobacco and drug use. Limiting alcohol use. Practicing safe sex. Taking low doses of aspirin every day. Taking vitamin and mineral supplements as recommended by your health care provider. What happens during an annual well check? The services and screenings done by your health care provider during your annual well check will depend on your age, overall health, lifestyle risk factors, and family history of disease. Counseling  Your health care provider may ask you questions about your: Alcohol use. Tobacco use. Drug use. Emotional well-being. Home and relationship well-being. Sexual activity. Eating habits. History of falls. Memory and ability to  understand (cognition). Work and work Statistician. Screening  You may have the following tests or measurements: Height, weight, and BMI. Blood pressure. Lipid and cholesterol levels. These may be checked every 5 years, or more frequently if you are over 25 years old. Skin check. Lung cancer screening. You may have this screening every year starting at age 13 if you have a 30-pack-year history of smoking and currently smoke or have quit within the past 15 years. Fecal occult blood test (FOBT) of the stool. You may have this test every year starting at age 41. Flexible sigmoidoscopy or colonoscopy. You may have a sigmoidoscopy every 5 years or a colonoscopy every 10 years starting at age 48. Prostate cancer screening. Recommendations will vary depending on your family history and other risks. Hepatitis C blood test. Hepatitis B blood test. Sexually transmitted disease (STD) testing. Diabetes screening. This is done by checking your blood sugar (glucose) after you have not eaten for a while (fasting). You may have this done every 1-3 years. Abdominal aortic aneurysm (AAA) screening. You may need this if you are a current or former smoker. Osteoporosis. You may be screened starting at age 64 if you are at high risk. Talk with your health care provider about your test results, treatment options, and if necessary, the need for more tests. Vaccines  Your health care provider may recommend certain vaccines, such as: Influenza vaccine. This is recommended every year. Tetanus, diphtheria, and acellular pertussis (Tdap, Td) vaccine. You may need a Td booster every 10 years. Zoster vaccine. You may need this after age 71. Pneumococcal 13-valent conjugate (PCV13) vaccine. One dose is recommended after age 46. Pneumococcal polysaccharide (PPSV23) vaccine. One dose is recommended after age 73. Talk  to your health care provider about which screenings and vaccines you need and how often you need them. This  information is not intended to replace advice given to you by your health care provider. Make sure you discuss any questions you have with your health care provider. Document Released: 02/24/2015 Document Revised: 10/18/2015 Document Reviewed: 11/29/2014 Elsevier Interactive Patient Education  2017 [place] Prevention in the Home Falls can cause injuries. They can happen to people of all ages. There are many things you can do to make your home safe and to help prevent falls. What can I do on the outside of my home? Regularly fix the edges of walkways and driveways and fix any cracks. Remove anything that might make you trip as you walk through a door, such as a raised step or threshold. Trim any bushes or trees on the path to your home. Use bright outdoor lighting. Clear any walking paths of anything that might make someone trip, such as rocks or tools. Regularly check to see if handrails are loose or broken. Make sure that both sides of any steps have handrails. Any raised decks and porches should have guardrails on the edges. Have any leaves, snow, or ice cleared regularly. Use sand or salt on walking paths during winter. Clean up any spills in your garage right away. This includes oil or grease spills. What can I do in the bathroom? Use night lights. Install grab bars by the toilet and in the tub and shower. Do not use towel bars as grab bars. Use non-skid mats or decals in the tub or shower. If you need to sit down in the shower, use a plastic, non-slip stool. Keep the floor dry. Clean up any water that spills on the floor as soon as it happens. Remove soap buildup in the tub or shower regularly. Attach bath mats securely with double-sided non-slip rug tape. Do not have throw rugs and other things on the floor that can make you trip. What can I do in the bedroom? Use night lights. Make sure that you have a light by your bed that is easy to reach. Do not use any sheets or  blankets that are too big for your bed. They should not hang down onto the floor. Have a firm chair that has side arms. You can use this for support while you get dressed. Do not have throw rugs and other things on the floor that can make you trip. What can I do in the kitchen? Clean up any spills right away. Avoid walking on wet floors. Keep items that you use a lot in easy-to-reach places. If you need to reach something above you, use a strong step stool that has a grab bar. Keep electrical cords out of the way. Do not use floor polish or wax that makes floors slippery. If you must use wax, use non-skid floor wax. Do not have throw rugs and other things on the floor that can make you trip. What can I do with my stairs? Do not leave any items on the stairs. Make sure that there are handrails on both sides of the stairs and use them. Fix handrails that are broken or loose. Make sure that handrails are as long as the stairways. Check any carpeting to make sure that it is firmly attached to the stairs. Fix any carpet that is loose or worn. Avoid having throw rugs at the top or bottom of the stairs. If you do have throw rugs,  attach them to the floor with carpet tape. Make sure that you have a light switch at the top of the stairs and the bottom of the stairs. If you do not have them, ask someone to add them for you. What else can I do to help prevent falls? Wear shoes that: Do not have high heels. Have rubber bottoms. Are comfortable and fit you well. Are closed at the toe. Do not wear sandals. If you use a stepladder: Make sure that it is fully opened. Do not climb a closed stepladder. Make sure that both sides of the stepladder are locked into place. Ask someone to hold it for you, if possible. Clearly mark and make sure that you can see: Any grab bars or handrails. First and last steps. Where the edge of each step is. Use tools that help you move around (mobility aids) if they are  needed. These include: Canes. Walkers. Scooters. Crutches. Turn on the lights when you go into a dark area. Replace any light bulbs as soon as they burn out. Set up your furniture so you have a clear path. Avoid moving your furniture around. If any of your floors are uneven, fix them. If there are any pets around you, be aware of where they are. Review your medicines with your doctor. Some medicines can make you feel dizzy. This can increase your chance of falling. Ask your doctor what other things that you can do to help prevent falls. This information is not intended to replace advice given to you by your health care provider. Make sure you discuss any questions you have with your health care provider. Document Released: 11/24/2008 Document Revised: 07/06/2015 Document Reviewed: 03/04/2014 Elsevier Interactive Patient Education  2017 [employer].

## 2020-11-17 ENCOUNTER — Other Ambulatory Visit: Payer: Self-pay | Admitting: Family Medicine

## 2020-11-29 ENCOUNTER — Other Ambulatory Visit: Payer: Self-pay | Admitting: Family Medicine

## 2020-12-04 ENCOUNTER — Ambulatory Visit (INDEPENDENT_AMBULATORY_CARE_PROVIDER_SITE_OTHER): Payer: Medicare HMO | Admitting: Family Medicine

## 2020-12-04 ENCOUNTER — Encounter: Payer: Self-pay | Admitting: Family Medicine

## 2020-12-04 ENCOUNTER — Other Ambulatory Visit: Payer: Self-pay

## 2020-12-04 VITALS — BP 140/74 | HR 52 | Temp 97.5°F | Ht 74.0 in | Wt 181.0 lb

## 2020-12-04 DIAGNOSIS — D696 Thrombocytopenia, unspecified: Secondary | ICD-10-CM | POA: Diagnosis not present

## 2020-12-04 DIAGNOSIS — R739 Hyperglycemia, unspecified: Secondary | ICD-10-CM

## 2020-12-04 DIAGNOSIS — Z23 Encounter for immunization: Secondary | ICD-10-CM

## 2020-12-04 DIAGNOSIS — I10 Essential (primary) hypertension: Secondary | ICD-10-CM | POA: Diagnosis not present

## 2020-12-04 DIAGNOSIS — N401 Enlarged prostate with lower urinary tract symptoms: Secondary | ICD-10-CM | POA: Diagnosis not present

## 2020-12-04 DIAGNOSIS — D72819 Decreased white blood cell count, unspecified: Secondary | ICD-10-CM

## 2020-12-04 DIAGNOSIS — K219 Gastro-esophageal reflux disease without esophagitis: Secondary | ICD-10-CM

## 2020-12-04 DIAGNOSIS — N529 Male erectile dysfunction, unspecified: Secondary | ICD-10-CM

## 2020-12-04 DIAGNOSIS — E039 Hypothyroidism, unspecified: Secondary | ICD-10-CM

## 2020-12-04 DIAGNOSIS — E782 Mixed hyperlipidemia: Secondary | ICD-10-CM

## 2020-12-04 DIAGNOSIS — N138 Other obstructive and reflux uropathy: Secondary | ICD-10-CM | POA: Diagnosis not present

## 2020-12-04 LAB — BASIC METABOLIC PANEL
BUN: 14 mg/dL (ref 6–23)
CO2: 27 mEq/L (ref 19–32)
Calcium: 9.3 mg/dL (ref 8.4–10.5)
Chloride: 105 mEq/L (ref 96–112)
Creatinine, Ser: 1.05 mg/dL (ref 0.40–1.50)
GFR: 69.92 mL/min (ref >60.00)
Glucose, Bld: 87 mg/dL (ref 70–99)
Potassium: 4.2 mEq/L (ref 3.5–5.1)
Sodium: 139 mEq/L (ref 135–145)

## 2020-12-04 LAB — CBC WITH DIFFERENTIAL/PLATELET
Basophils Absolute: 0 10*3/uL (ref 0.0–0.1)
Basophils Relative: 0.5 % (ref 0.0–3.0)
Eosinophils Absolute: 0.1 10*3/uL (ref 0.0–0.7)
Eosinophils Relative: 1.8 % (ref 0.0–5.0)
HCT: 47.4 % (ref 39.0–52.0)
Hemoglobin: 15.9 g/dL (ref 13.0–17.0)
Lymphocytes Relative: 25.2 % (ref 12.0–46.0)
Lymphs Abs: 0.9 10*3/uL (ref 0.7–4.0)
MCHC: 33.5 g/dL (ref 30.0–36.0)
MCV: 90.1 fl (ref 78.0–100.0)
Monocytes Absolute: 0.2 10*3/uL (ref 0.1–1.0)
Monocytes Relative: 5 % (ref 3.0–12.0)
Neutro Abs: 2.4 10*3/uL (ref 1.4–7.7)
Neutrophils Relative %: 67.5 % (ref 43.0–77.0)
Platelets: 102 10*3/uL — ABNORMAL LOW (ref 150.0–400.0)
RBC: 5.26 Mil/uL (ref 4.22–5.81)
RDW: 14.2 % (ref 11.5–15.5)
WBC: 3.5 10*3/uL — ABNORMAL LOW (ref 4.0–10.5)

## 2020-12-04 LAB — TSH: TSH: 0.47 u[IU]/mL (ref 0.35–5.50)

## 2020-12-04 LAB — HEPATIC FUNCTION PANEL
ALT: 17 U/L (ref 0–53)
AST: 16 U/L (ref 0–37)
Albumin: 4.2 g/dL (ref 3.5–5.2)
Alkaline Phosphatase: 60 U/L (ref 39–117)
Bilirubin, Direct: 0.2 mg/dL (ref 0.0–0.3)
Total Bilirubin: 0.7 mg/dL (ref 0.2–1.2)
Total Protein: 6.9 g/dL (ref 6.0–8.3)

## 2020-12-04 LAB — HEMOGLOBIN A1C: Hgb A1c MFr Bld: 5 % (ref 4.6–6.5)

## 2020-12-04 LAB — LIPID PANEL
Cholesterol: 143 mg/dL (ref 0–200)
HDL: 54.4 mg/dL (ref >39.00)
LDL Cholesterol: 77 mg/dL (ref 0–99)
NonHDL: 88.86
Total CHOL/HDL Ratio: 3
Triglycerides: 57 mg/dL (ref 0.0–149.0)
VLDL: 11.4 mg/dL (ref 0.0–40.0)

## 2020-12-04 LAB — PSA: PSA: 0.61 ng/mL (ref 0.10–4.00)

## 2020-12-04 LAB — T4, FREE: Free T4: 1.12 ng/dL (ref 0.60–1.60)

## 2020-12-04 LAB — T3, FREE: T3, Free: 3 pg/mL (ref 2.3–4.2)

## 2020-12-04 MED ORDER — SIMVASTATIN 40 MG PO TABS: ORAL_TABLET | ORAL | 3 refills | Status: DC

## 2020-12-04 NOTE — Addendum Note (Signed)
Addended by: Wyvonne Lenz on: 12/04/2020 10:27 AM   Modules accepted: Orders

## 2020-12-04 NOTE — Progress Notes (Signed)
Subjective:    Patient ID: Sean Mcgee, male    DOB: 03-May-1945, 75 y.o.   MRN: [id number]  HPI Here to follow up on issues. He feels great in general. His BP has been well controlled. His ED is stable. The GERD is stable.    Review of Systems  Constitutional: Negative.   HENT: Negative.    Eyes: Negative.   Respiratory: Negative.    Cardiovascular: Negative.   Gastrointestinal: Negative.   Genitourinary: Negative.   Musculoskeletal: Negative.   Skin: Negative.   Neurological: Negative.   Psychiatric/Behavioral: Negative.        Objective:   Physical Exam Constitutional:      General: He is not in acute distress.    Appearance: Normal appearance. He is well-developed. He is not diaphoretic.  HENT:     Head: Normocephalic and atraumatic.     Right Ear: External ear normal.     Left Ear: External ear normal.     Nose: Nose normal.     Mouth/Throat:     Pharynx: No oropharyngeal exudate.  Eyes:     General: No scleral icterus.       Right eye: No discharge.        Left eye: No discharge.     Conjunctiva/sclera: Conjunctivae normal.     Pupils: Pupils are equal, round, and reactive to light.  Neck:     Thyroid: No thyromegaly.     Vascular: No JVD.     Trachea: No tracheal deviation.  Cardiovascular:     Rate and Rhythm: Normal rate and regular rhythm.     Heart sounds: Normal heart sounds. No murmur heard.   No friction rub. No gallop.  Pulmonary:     Effort: Pulmonary effort is normal. No respiratory distress.     Breath sounds: Normal breath sounds. No wheezing or rales.  Chest:     Chest wall: No tenderness.  Abdominal:     General: Bowel sounds are normal. There is no distension.     Palpations: Abdomen is soft. There is no mass.     Tenderness: There is no abdominal tenderness. There is no guarding or rebound.  Genitourinary:    Penis: Normal. No tenderness.      Testes: Normal.  Musculoskeletal:        General: No tenderness. Normal range of  motion.     Cervical back: Neck supple.  Lymphadenopathy:     Cervical: No cervical adenopathy.  Skin:    General: Skin is warm and dry.     Coloration: Skin is not pale.     Findings: No erythema or rash.  Neurological:     Mental Status: He is alert and oriented to person, place, and time.     Cranial Nerves: No cranial nerve deficit.     Motor: No abnormal muscle tone.     Coordination: Coordination normal.     Deep Tendon Reflexes: Reflexes are normal and symmetric. Reflexes normal.  Psychiatric:        Behavior: Behavior normal.        Thought Content: Thought content normal.        Judgment: Judgment normal.          Assessment & Plan:  His HTN and GERD and ED are stable we will check fasting labs today for lipids, etc. Check a thyroid panel for the hypothyroidism. Given shots for flu and pneumonia.  We spent a total of ( 31  ) minutes  reviewing records and discussing these issues.  Alysia Penna, MD

## 2020-12-04 NOTE — Addendum Note (Signed)
Addended by: Rosalyn Gess D on: 12/04/2020 10:25 AM   Modules accepted: Orders

## 2020-12-05 DIAGNOSIS — D72819 Decreased white blood cell count, unspecified: Secondary | ICD-10-CM | POA: Insufficient documentation

## 2020-12-05 DIAGNOSIS — D696 Thrombocytopenia, unspecified: Secondary | ICD-10-CM | POA: Insufficient documentation

## 2020-12-05 NOTE — Addendum Note (Signed)
Addended by: Alysia Penna A on: 12/05/2020 12:41 PM   Modules accepted: Orders

## 2020-12-18 ENCOUNTER — Inpatient Hospital Stay: Payer: Medicare HMO

## 2020-12-18 ENCOUNTER — Other Ambulatory Visit: Payer: Self-pay

## 2020-12-18 ENCOUNTER — Inpatient Hospital Stay: Payer: Medicare HMO | Attending: Oncology | Admitting: Oncology

## 2020-12-18 ENCOUNTER — Encounter: Payer: Self-pay | Admitting: Oncology

## 2020-12-18 VITALS — BP 166/74 | HR 55 | Temp 97.9°F | Resp 18 | Wt 183.0 lb

## 2020-12-18 DIAGNOSIS — Z85828 Personal history of other malignant neoplasm of skin: Secondary | ICD-10-CM | POA: Insufficient documentation

## 2020-12-18 DIAGNOSIS — Z7289 Other problems related to lifestyle: Secondary | ICD-10-CM | POA: Insufficient documentation

## 2020-12-18 DIAGNOSIS — D72819 Decreased white blood cell count, unspecified: Secondary | ICD-10-CM

## 2020-12-18 DIAGNOSIS — Z8249 Family history of ischemic heart disease and other diseases of the circulatory system: Secondary | ICD-10-CM | POA: Diagnosis not present

## 2020-12-18 DIAGNOSIS — Z800 Family history of malignant neoplasm of digestive organs: Secondary | ICD-10-CM | POA: Insufficient documentation

## 2020-12-18 DIAGNOSIS — E039 Hypothyroidism, unspecified: Secondary | ICD-10-CM | POA: Diagnosis not present

## 2020-12-18 DIAGNOSIS — E785 Hyperlipidemia, unspecified: Secondary | ICD-10-CM | POA: Diagnosis not present

## 2020-12-18 DIAGNOSIS — N529 Male erectile dysfunction, unspecified: Secondary | ICD-10-CM | POA: Insufficient documentation

## 2020-12-18 DIAGNOSIS — Z79899 Other long term (current) drug therapy: Secondary | ICD-10-CM | POA: Insufficient documentation

## 2020-12-18 DIAGNOSIS — E538 Deficiency of other specified B group vitamins: Secondary | ICD-10-CM | POA: Insufficient documentation

## 2020-12-18 DIAGNOSIS — E782 Mixed hyperlipidemia: Secondary | ICD-10-CM | POA: Insufficient documentation

## 2020-12-18 DIAGNOSIS — D696 Thrombocytopenia, unspecified: Secondary | ICD-10-CM

## 2020-12-18 DIAGNOSIS — I10 Essential (primary) hypertension: Secondary | ICD-10-CM | POA: Diagnosis not present

## 2020-12-18 DIAGNOSIS — K219 Gastro-esophageal reflux disease without esophagitis: Secondary | ICD-10-CM | POA: Insufficient documentation

## 2020-12-18 LAB — TECHNOLOGIST SMEAR REVIEW: Plt Morphology: DECREASED

## 2020-12-18 LAB — VITAMIN B12: Vitamin B-12: 165 pg/mL — ABNORMAL LOW (ref 180–914)

## 2020-12-18 LAB — CBC WITH DIFFERENTIAL/PLATELET
Abs Immature Granulocytes: 0.01 10*3/uL (ref 0.00–0.07)
Basophils Absolute: 0 10*3/uL (ref 0.0–0.1)
Basophils Relative: 0 %
Eosinophils Absolute: 0.2 10*3/uL (ref 0.0–0.5)
Eosinophils Relative: 4 %
HCT: 46.9 % (ref 39.0–52.0)
Hemoglobin: 15.8 g/dL (ref 13.0–17.0)
Immature Granulocytes: 0 %
Lymphocytes Relative: 25 %
Lymphs Abs: 1.1 10*3/uL (ref 0.7–4.0)
MCH: 30.3 pg (ref 26.0–34.0)
MCHC: 33.7 g/dL (ref 30.0–36.0)
MCV: 90 fL (ref 80.0–100.0)
Monocytes Absolute: 0.4 10*3/uL (ref 0.1–1.0)
Monocytes Relative: 10 %
Neutro Abs: 2.7 10*3/uL (ref 1.7–7.7)
Neutrophils Relative %: 61 %
Platelets: 124 10*3/uL — ABNORMAL LOW (ref 150–400)
RBC: 5.21 MIL/uL (ref 4.22–5.81)
RDW: 13.8 % (ref 11.5–15.5)
WBC: 4.5 10*3/uL (ref 4.0–10.5)
nRBC: 0 % (ref 0.0–0.2)

## 2020-12-18 LAB — HEPATITIS C ANTIBODY: HCV Ab: NONREACTIVE

## 2020-12-18 LAB — FOLATE: Folate: 16.2 ng/mL (ref >5.9)

## 2020-12-18 LAB — HIV ANTIBODY (ROUTINE TESTING W REFLEX): HIV Screen 4th Generation wRfx: NONREACTIVE

## 2020-12-18 NOTE — Progress Notes (Signed)
Hematology/Oncology Consult note [facility] Telephone:([phone] Fax:([phone]  Patient Care Team: Laurey Morale, MD as PCP - General   Name of the patient: Sean Mcgee  [id number]  06-Jul-1945    Reason for referral-leukopenia/thrombocytopenia   Referring physician-Dr. 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  Previously patient was known to have a normal white cell count.  On his recent CBC on 12/04/2020 his white cell count was lower as compared to his baseline at 3.5 with a normal differential and mild neutropenia with an ANC of 2.4.  Platelets were lower at 102 with an H&H of 15.9/47.4.  Patient denies any new medications or over-the-counter supplements.  He is otherwise doing well and denies any changes in his appetite or weight.  Denies any drenching night sweats.  ECOG PS- 0  Pain scale- 0   Review of systems- Review of Systems  Constitutional:  Negative for chills, fever, malaise/fatigue and weight loss.  HENT:  Negative for congestion, ear discharge and nosebleeds.   Eyes:  Negative for blurred vision.  Respiratory:  Negative for cough, hemoptysis, sputum production, shortness of breath and wheezing.   Cardiovascular:  Negative for chest pain, palpitations, orthopnea and claudication.  Gastrointestinal:  Negative for abdominal pain, blood in stool, constipation, diarrhea, heartburn, melena, nausea and vomiting.  Genitourinary:  Negative for dysuria, flank pain, frequency, hematuria and urgency.  Musculoskeletal:  Negative for back pain, joint pain and myalgias.  Skin:  Negative for rash.  Neurological:   Negative for dizziness, tingling, focal weakness, seizures, weakness and headaches.  Endo/Heme/Allergies:  Does not bruise/bleed easily.  Psychiatric/Behavioral:  Negative for depression and suicidal ideas. The patient does not have insomnia.    No Known Allergies  Patient Active Problem List   Diagnosis Date Noted   Thrombocytopenia ([place]) 12/05/2020   Leukopenia 12/05/2020   ED (erectile dysfunction) 07/22/2007   Hypothyroidism 09/30/2006   Hyperlipemia, mixed 09/30/2006   Essential hypertension 09/30/2006   GERD 09/30/2006     Past Medical History:  Diagnosis Date   ED (erectile dysfunction)    GERD (gastroesophageal reflux disease)    Hyperlipemia    Hypertension    Hypothyroid    Low testosterone    Skin cancer    basel cell removed in office      Past Surgical History:  Procedure Laterality Date   COLONOSCOPY  02/23/2020   per Dr. Havery Moros, adenomatous and sessile serrated polyps, repeat in 5 years   HERNIA REPAIR     inguinal  bilateral   POLYPECTOMY     VASECTOMY      Social History   Socioeconomic History   Marital status: Single    Spouse name: Not on file   Number of children: Not on file   Years of education: Not on file   Highest education level: Not on file  Occupational History   Not on file  Tobacco Use   Smoking status: Never   Smokeless tobacco: Never  Vaping Use   Vaping Use: Never used  Substance and Sexual Activity   Alcohol use: Yes    Alcohol/week: 6.0 standard drinks    Types: 6 Glasses of wine per week    Comment:  glass of wine   Drug use: No   Sexual activity: Not on file  Other Topics Concern   Not on file  Social History Narrative   Not on file   Social Determinants of Health   Financial Resource Strain: Low Risk    Difficulty of Paying Living Expenses: Not hard at all  Food Insecurity: No Food Insecurity   Worried About Running Out of Food in the Last Year: Never true   [place] in the Last Year: Never true   Transportation Needs: No Transportation Needs   Lack of Transportation (Medical): No   Lack of Transportation (Non-Medical): No  Physical Activity: Insufficiently Active   Days of Exercise per Week: 3 days   Minutes of Exercise per Session: 30 min  Stress: No Stress Concern Present   Feeling of Stress : Not at all  Social Connections: Socially Integrated   Frequency of Communication with Friends and Family: Three times a week   Frequency of Social Gatherings with Friends and Family: Three times a week   Attends Religious Services: More than 4 times per year   Active Member of Clubs or Organizations: Yes   Attends Music therapist: More than 4 times per year   Marital Status: Married  Human resources officer Violence: Not At Risk   Fear of Current or Ex-Partner: No   Emotionally Abused: No   Physically Abused: No   Sexually Abused: No     Family History  Problem Relation Age of Onset   Coronary artery disease Other    Cancer Other        abdominal   Stomach cancer Father    Colon cancer Neg Hx    Esophageal cancer Neg Hx    Rectal cancer Neg Hx    Colon polyps Neg Hx      Current Outpatient Medications:    benazepril (LOTENSIN) 20 MG tablet, TAKE 1 TABLET BY MOUTH ONCE A DAY, Disp: 90 tablet, Rfl: 0   esomeprazole (NEXIUM) 40 MG capsule, Take 40 mg by mouth daily at 12 noon., Disp: , Rfl:    levothyroxine (SYNTHROID) 150 MCG tablet, TAKE 1 TABLET BY MOUTH ONCE A DAY BEFOREBREAKFAST, Disp: 90 tablet, Rfl: 1   metoprolol succinate (TOPROL-XL) 50 MG 24 hr tablet, TAKE 1 TABLET BY MOUTH ONCE DAILY TAKE WITH OR IMMEDIATELY FOLLOWING A MEAL, Disp: 90 tablet, Rfl: 3   simvastatin (ZOCOR) 40 MG tablet, TAKE 1 TABLET BY MOUTH EVERY OTHER NIGHT AT BEDTIME, Disp: 45 tablet, Rfl: 3   omeprazole (PRILOSEC) 20 MG capsule, Take 1 capsule (20 mg total) by mouth every other day. (Patient not taking: Reported on 12/18/2020), Disp: 30 capsule, Rfl: 3   Physical exam:  Vitals:    12/18/20 1050  BP: (!) 166/74  Pulse: (!) 55  Resp: 18  Temp: 97.9 F (36.6 C)  SpO2: 100%  Weight: 183 lb (83 kg)   Physical Exam Constitutional:      General: He is not in acute distress. Cardiovascular:     Rate and Rhythm: Normal rate and regular rhythm.     Heart sounds: Normal heart sounds.  Pulmonary:     Effort: Pulmonary effort is normal.     Breath sounds: Normal breath sounds.  Abdominal:     General: Bowel sounds are normal.     Palpations: Abdomen is soft.     Comments: No palpable hepatosplenomegaly  Lymphadenopathy:     Comments: No palpable cervical, supraclavicular, axillary or inguinal  adenopathy    Skin:    General: Skin is warm and dry.  Neurological:     Mental Status: He is alert and oriented to person, place, and time.       CMP Latest Ref Rng & Units 12/04/2020  Glucose 70 - 99 mg/dL 87  BUN 6 - 23 mg/dL 14  Creatinine 0.40 - 1.50 mg/dL 1.05  Sodium 135 - 145 mEq/L 139  Potassium 3.5 - 5.1 mEq/L 4.2  Chloride 96 - 112 mEq/L 105  CO2 19 - 32 mEq/L 27  Calcium 8.4 - 10.5 mg/dL 9.3  Total Protein 6.0 - 8.3 g/dL 6.9  Total Bilirubin 0.2 - 1.2 mg/dL 0.7  Alkaline Phos 39 - 117 U/L 60  AST 0 - 37 U/L 16  ALT 0 - 53 U/L 17   CBC Latest Ref Rng & Units 12/18/2020  WBC 4.0 - 10.5 K/uL 4.5  Hemoglobin 13.0 - 17.0 g/dL 15.8  Hematocrit 39.0 - 52.0 % 46.9  Platelets 150 - 400 K/uL 124(L)   Assessment and plan- Patient is a 75 y.o. male referred for leukopenia/thrombocytopenia  Thrombocytopenia: This has been chronic at least dating back to 2016 and his platelets typically range between 120s to 130s.  Recently they were lower at 109.  Suspect this could be autoimmune.  Patient does have hypothyroidism which is typically autoimmune and such conditions can coexist.  Leukopenia: Unclear if this is a 1 off transient value.  Differential was normal with a mildly low neutrophil count of 2.4.  I will repeat a CBC with differential today.  I will also  check B12 folate HIV and hepatitis C today and see him back in 2 weeks for in person or video visit   Thank you for this kind referral and the opportunity to participate in the care of this patient   Visit Diagnosis 1. Thrombocytopenia ([facility])   2. Leukopenia, unspecified type     Dr. Randa Evens, MD, MPH [facility] at [facility] [phone] 12/18/2020

## 2020-12-18 NOTE — Progress Notes (Signed)
Pt is curious to know if his pneumonia and flu vaccine triggered his WBC and Platelets to decrease since he received both vaccines 10 minutes before his blood work was drawn.

## 2020-12-25 ENCOUNTER — Telehealth: Payer: Self-pay | Admitting: Oncology

## 2020-12-25 NOTE — Telephone Encounter (Signed)
Pt would like to change appt to a telephone visit. Same day and same time.please call him to confirm this at [phone]

## 2021-01-03 ENCOUNTER — Inpatient Hospital Stay: Payer: Medicare HMO | Admitting: Oncology

## 2021-01-03 ENCOUNTER — Ambulatory Visit: Payer: Medicare HMO | Admitting: Oncology

## 2021-01-03 ENCOUNTER — Other Ambulatory Visit: Payer: Self-pay

## 2021-01-03 ENCOUNTER — Encounter: Payer: Self-pay | Admitting: Oncology

## 2021-01-03 ENCOUNTER — Inpatient Hospital Stay: Payer: Medicare HMO

## 2021-01-03 VITALS — BP 162/80 | HR 53 | Temp 97.6°F | Resp 18 | Wt 181.1 lb

## 2021-01-03 DIAGNOSIS — K219 Gastro-esophageal reflux disease without esophagitis: Secondary | ICD-10-CM | POA: Diagnosis not present

## 2021-01-03 DIAGNOSIS — E039 Hypothyroidism, unspecified: Secondary | ICD-10-CM | POA: Diagnosis not present

## 2021-01-03 DIAGNOSIS — D72819 Decreased white blood cell count, unspecified: Secondary | ICD-10-CM | POA: Diagnosis not present

## 2021-01-03 DIAGNOSIS — N529 Male erectile dysfunction, unspecified: Secondary | ICD-10-CM | POA: Diagnosis not present

## 2021-01-03 DIAGNOSIS — E785 Hyperlipidemia, unspecified: Secondary | ICD-10-CM | POA: Diagnosis not present

## 2021-01-03 DIAGNOSIS — E782 Mixed hyperlipidemia: Secondary | ICD-10-CM | POA: Diagnosis not present

## 2021-01-03 DIAGNOSIS — E538 Deficiency of other specified B group vitamins: Secondary | ICD-10-CM

## 2021-01-03 DIAGNOSIS — D696 Thrombocytopenia, unspecified: Secondary | ICD-10-CM | POA: Diagnosis not present

## 2021-01-03 DIAGNOSIS — Z7289 Other problems related to lifestyle: Secondary | ICD-10-CM | POA: Diagnosis not present

## 2021-01-03 DIAGNOSIS — I10 Essential (primary) hypertension: Secondary | ICD-10-CM | POA: Diagnosis not present

## 2021-01-03 MED ORDER — CYANOCOBALAMIN 1000 MCG/ML IJ SOLN
1000.0000 ug | Freq: Once | INTRAMUSCULAR | Status: AC
  Administered 2021-01-03: 1000 ug via INTRAMUSCULAR
  Filled 2021-01-03: qty 1

## 2021-01-03 NOTE — Progress Notes (Signed)
Pt has no new concerns for todays visit. 

## 2021-01-03 NOTE — Progress Notes (Signed)
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  Previously patient was known to have a normal white cell count.  On his recent CBC on 12/04/2020 his white cell count was lower as compared to his baseline at 3.5 with a normal differential and mild neutropenia with an ANC of 2.4.  Platelets were lower at 102 with an H&H of 15.9/47.4.     Interval history-patient is doing well overall.  Denies any new complaints at this time  ECOG PS- 0 Pain scale- 0  Review of systems- Review of Systems  Constitutional:  Negative for chills, fever, malaise/fatigue and weight loss.  HENT:  Negative for congestion, ear discharge and nosebleeds.   Eyes:  Negative for blurred vision.  Respiratory:  Negative for cough, hemoptysis, sputum production, shortness of breath and wheezing.   Cardiovascular:  Negative for chest pain, palpitations, orthopnea and claudication.  Gastrointestinal:  Negative for abdominal pain, blood in stool, constipation, diarrhea, heartburn, melena, nausea and vomiting.  Genitourinary:  Negative for dysuria, flank pain, frequency, hematuria and urgency.  Musculoskeletal:  Negative for back pain, joint pain and myalgias.  Skin:  Negative for rash.  Neurological:  Negative for dizziness, tingling, focal weakness,  seizures, weakness and headaches.  Endo/Heme/Allergies:  Does not bruise/bleed easily.  Psychiatric/Behavioral:  Negative for depression and suicidal ideas. The patient does not have insomnia.       No Known Allergies   Past Medical History:  Diagnosis Date   ED (erectile dysfunction)    GERD (gastroesophageal reflux disease)    Hyperlipemia    Hypertension    Hypothyroid    Low testosterone    Skin cancer    basel cell removed in office      Past Surgical History:  Procedure Laterality Date   COLONOSCOPY  02/23/2020   per Dr. Adela Lank, adenomatous and sessile serrated polyps, repeat in 5 years   HERNIA REPAIR     inguinal  bilateral   POLYPECTOMY     VASECTOMY      Social History   Socioeconomic History   Marital status: Single    Spouse name: Not on file   Number of children: Not on file   Years of education: Not on file   Highest education level: Not on file  Occupational History   Not on file  Tobacco Use   Smoking status: Never   Smokeless tobacco: Never  Vaping Use   Vaping Use: Never used  Substance and Sexual Activity   Alcohol use: Yes    Alcohol/week: 6.0 standard drinks    Types: 6 Glasses of wine per week    Comment: glass of wine   Drug use: No   Sexual activity: Not on file  Other Topics Concern   Not on file  Social History Narrative   Not on file   Social Determinants of Health   Financial Resource Strain: Low Risk  Difficulty of Paying Living Expenses: Not hard at all  Food Insecurity: No Food Insecurity   Worried About [place] in the Last Year: Never true   Ran Out of Food in the Last Year: Never true  Transportation Needs: No Transportation Needs   Lack of Transportation (Medical): No   Lack of Transportation (Non-Medical): No  Physical Activity: Insufficiently Active   Days of Exercise per Week: 3 days   Minutes of Exercise per Session: 30 min  Stress: No Stress Concern Present   Feeling of Stress : Not at  all  Social Connections: Socially Integrated   Frequency of Communication with Friends and Family: Three times a week   Frequency of Social Gatherings with Friends and Family: Three times a week   Attends Religious Services: More than 4 times per year   Active Member of Clubs or Organizations: Yes   Attends Music therapist: More than 4 times per year   Marital Status: Married  Human resources officer Violence: Not At Risk   Fear of Current or Ex-Partner: No   Emotionally Abused: No   Physically Abused: No   Sexually Abused: No    Family History  Problem Relation Age of Onset   Coronary artery disease Other    Cancer Other        abdominal   Stomach cancer Father    Colon cancer Neg Hx    Esophageal cancer Neg Hx    Rectal cancer Neg Hx    Colon polyps Neg Hx      Current Outpatient Medications:    benazepril (LOTENSIN) 20 MG tablet, TAKE 1 TABLET BY MOUTH ONCE A DAY, Disp: 90 tablet, Rfl: 0   esomeprazole (NEXIUM) 40 MG capsule, Take 40 mg by mouth daily at 12 noon., Disp: , Rfl:    levothyroxine (SYNTHROID) 150 MCG tablet, TAKE 1 TABLET BY MOUTH ONCE A DAY BEFOREBREAKFAST, Disp: 90 tablet, Rfl: 1   metoprolol succinate (TOPROL-XL) 50 MG 24 hr tablet, TAKE 1 TABLET BY MOUTH ONCE DAILY TAKE WITH OR IMMEDIATELY FOLLOWING A MEAL, Disp: 90 tablet, Rfl: 3   simvastatin (ZOCOR) 40 MG tablet, TAKE 1 TABLET BY MOUTH EVERY OTHER NIGHT AT BEDTIME, Disp: 45 tablet, Rfl: 3   omeprazole (PRILOSEC) 20 MG capsule, Take 1 capsule (20 mg total) by mouth every other day. (Patient not taking: Reported on 12/18/2020), Disp: 30 capsule, Rfl: 3  Physical exam:  Vitals:   01/03/21 1022  BP: (!) 162/80  Pulse: (!) 53  Resp: 18  Temp: 97.6 F (36.4 C)  SpO2: 100%  Weight: 181 lb 1.6 oz (82.1 kg)   Physical Exam Constitutional:      General: He is not in acute distress. Cardiovascular:     Rate and Rhythm: Normal rate and regular rhythm.     Heart sounds: Normal heart sounds.   Pulmonary:     Effort: Pulmonary effort is normal.  Skin:    General: Skin is warm and dry.  Neurological:     Mental Status: He is alert and oriented to person, place, and time.     CMP Latest Ref Rng & Units 12/04/2020  Glucose 70 - 99 mg/dL 87  BUN 6 - 23 mg/dL 14  Creatinine 0.40 - 1.50 mg/dL 1.05  Sodium 135 - 145 mEq/L 139  Potassium 3.5 - 5.1 mEq/L 4.2  Chloride 96 - 112 mEq/L 105  CO2 19 - 32 mEq/L 27  Calcium 8.4 - 10.5 mg/dL 9.3  Total Protein  6.0 - 8.3 g/dL 6.9  Total Bilirubin 0.2 - 1.2 mg/dL 0.7  Alkaline Phos 39 - 117 U/L 60  AST 0 - 37 U/L 16  ALT 0 - 53 U/L 17   CBC Latest Ref Rng & Units 12/18/2020  WBC 4.0 - 10.5 K/uL 4.5  Hemoglobin 13.0 - 17.0 g/dL 15.8  Hematocrit 39.0 - 52.0 % 46.9  Platelets 150 - 400 K/uL 124(L)    Assessment and plan- Patient is a 75 y.o. male referred for leukopenia and thrombocytopenia  Leukopenia: His white cell count has been waxing and waning between 3.5-5.  Presently when I checked it again it was Normal at 4.5.  Differential is normal.  Continue to monitor.  No need for a bone marrow biopsy at this time  Thrombocytopenia: This has been mild and stable in the 120s at least dating back to 2016.  Possibly secondary to ITP continue to monitor  B12 deficiency: Patient found to have a low B12 level of 165.  He will receive 1 dose of B12 injection today and start taking oral B12 1000 mcg daily.  CBC with differential and B12 levels in 2 months in 4 months and I will see him back in 4 months.  If B12 levels remain low despite oral supplementation we will switch him to B12 injections at that time   Visit Diagnosis 1. Thrombocytopenia ([place])   2. B12 deficiency      Dr. Randa Evens, MD, MPH [facility] at [facility] [phone] 01/03/2021 3:03 PM

## 2021-02-16 ENCOUNTER — Other Ambulatory Visit: Payer: Self-pay | Admitting: Family Medicine

## 2021-02-23 DIAGNOSIS — L570 Actinic keratosis: Secondary | ICD-10-CM | POA: Diagnosis not present

## 2021-02-23 DIAGNOSIS — Z85828 Personal history of other malignant neoplasm of skin: Secondary | ICD-10-CM | POA: Diagnosis not present

## 2021-02-23 DIAGNOSIS — C44519 Basal cell carcinoma of skin of other part of trunk: Secondary | ICD-10-CM | POA: Diagnosis not present

## 2021-02-23 DIAGNOSIS — D225 Melanocytic nevi of trunk: Secondary | ICD-10-CM | POA: Diagnosis not present

## 2021-02-23 DIAGNOSIS — D2262 Melanocytic nevi of left upper limb, including shoulder: Secondary | ICD-10-CM | POA: Diagnosis not present

## 2021-02-23 DIAGNOSIS — D2261 Melanocytic nevi of right upper limb, including shoulder: Secondary | ICD-10-CM | POA: Diagnosis not present

## 2021-02-23 DIAGNOSIS — C44612 Basal cell carcinoma of skin of right upper limb, including shoulder: Secondary | ICD-10-CM | POA: Diagnosis not present

## 2021-02-23 DIAGNOSIS — D2272 Melanocytic nevi of left lower limb, including hip: Secondary | ICD-10-CM | POA: Diagnosis not present

## 2021-02-23 DIAGNOSIS — D045 Carcinoma in situ of skin of trunk: Secondary | ICD-10-CM | POA: Diagnosis not present

## 2021-02-23 DIAGNOSIS — D485 Neoplasm of uncertain behavior of skin: Secondary | ICD-10-CM | POA: Diagnosis not present

## 2021-03-05 ENCOUNTER — Other Ambulatory Visit: Payer: Self-pay

## 2021-03-05 ENCOUNTER — Inpatient Hospital Stay: Payer: Medicare HMO | Attending: Oncology

## 2021-03-05 DIAGNOSIS — D696 Thrombocytopenia, unspecified: Secondary | ICD-10-CM | POA: Insufficient documentation

## 2021-03-05 LAB — CBC WITH DIFFERENTIAL/PLATELET
Abs Immature Granulocytes: 0.02 10*3/uL (ref 0.00–0.07)
Basophils Absolute: 0 10*3/uL (ref 0.0–0.1)
Basophils Relative: 0 %
Eosinophils Absolute: 0.2 10*3/uL (ref 0.0–0.5)
Eosinophils Relative: 3 %
HCT: 47.9 % (ref 39.0–52.0)
Hemoglobin: 16.1 g/dL (ref 13.0–17.0)
Immature Granulocytes: 0 %
Lymphocytes Relative: 23 %
Lymphs Abs: 1.4 10*3/uL (ref 0.7–4.0)
MCH: 30.3 pg (ref 26.0–34.0)
MCHC: 33.6 g/dL (ref 30.0–36.0)
MCV: 90 fL (ref 80.0–100.0)
Monocytes Absolute: 0.6 10*3/uL (ref 0.1–1.0)
Monocytes Relative: 9 %
Neutro Abs: 4 10*3/uL (ref 1.7–7.7)
Neutrophils Relative %: 65 %
Platelets: 119 10*3/uL — ABNORMAL LOW (ref 150–400)
RBC: 5.32 MIL/uL (ref 4.22–5.81)
RDW: 13.7 % (ref 11.5–15.5)
WBC: 6.1 10*3/uL (ref 4.0–10.5)
nRBC: 0 % (ref 0.0–0.2)

## 2021-03-05 LAB — VITAMIN B12: Vitamin B-12: 377 pg/mL (ref 180–914)

## 2021-03-15 DIAGNOSIS — L905 Scar conditions and fibrosis of skin: Secondary | ICD-10-CM | POA: Diagnosis not present

## 2021-03-15 DIAGNOSIS — C44519 Basal cell carcinoma of skin of other part of trunk: Secondary | ICD-10-CM | POA: Diagnosis not present

## 2021-03-29 DIAGNOSIS — C44519 Basal cell carcinoma of skin of other part of trunk: Secondary | ICD-10-CM | POA: Diagnosis not present

## 2021-03-29 DIAGNOSIS — D045 Carcinoma in situ of skin of trunk: Secondary | ICD-10-CM | POA: Diagnosis not present

## 2021-04-12 DIAGNOSIS — H26493 Other secondary cataract, bilateral: Secondary | ICD-10-CM | POA: Diagnosis not present

## 2021-04-30 ENCOUNTER — Telehealth: Payer: Self-pay | Admitting: *Deleted

## 2021-04-30 ENCOUNTER — Encounter: Payer: Self-pay | Admitting: *Deleted

## 2021-04-30 NOTE — Telephone Encounter (Signed)
Patient has appointment for lab tomorrow and would like to know why his appointment with Dr. Janese Banks has been changed to a My chart Visit. ?

## 2021-05-01 ENCOUNTER — Encounter: Payer: Self-pay | Admitting: Oncology

## 2021-05-01 ENCOUNTER — Other Ambulatory Visit: Payer: Self-pay

## 2021-05-01 ENCOUNTER — Inpatient Hospital Stay: Payer: Medicare HMO | Admitting: Oncology

## 2021-05-01 ENCOUNTER — Inpatient Hospital Stay: Payer: Medicare HMO | Attending: Oncology

## 2021-05-01 VITALS — BP 169/90 | HR 83 | Temp 98.7°F | Resp 20 | Wt 183.6 lb

## 2021-05-01 DIAGNOSIS — I10 Essential (primary) hypertension: Secondary | ICD-10-CM | POA: Insufficient documentation

## 2021-05-01 DIAGNOSIS — E039 Hypothyroidism, unspecified: Secondary | ICD-10-CM | POA: Insufficient documentation

## 2021-05-01 DIAGNOSIS — D696 Thrombocytopenia, unspecified: Secondary | ICD-10-CM | POA: Insufficient documentation

## 2021-05-01 DIAGNOSIS — Z7289 Other problems related to lifestyle: Secondary | ICD-10-CM | POA: Insufficient documentation

## 2021-05-01 DIAGNOSIS — K219 Gastro-esophageal reflux disease without esophagitis: Secondary | ICD-10-CM | POA: Insufficient documentation

## 2021-05-01 DIAGNOSIS — Z8249 Family history of ischemic heart disease and other diseases of the circulatory system: Secondary | ICD-10-CM | POA: Diagnosis not present

## 2021-05-01 DIAGNOSIS — Z79899 Other long term (current) drug therapy: Secondary | ICD-10-CM | POA: Diagnosis not present

## 2021-05-01 DIAGNOSIS — E538 Deficiency of other specified B group vitamins: Secondary | ICD-10-CM | POA: Insufficient documentation

## 2021-05-01 DIAGNOSIS — E785 Hyperlipidemia, unspecified: Secondary | ICD-10-CM | POA: Diagnosis not present

## 2021-05-01 DIAGNOSIS — D751 Secondary polycythemia: Secondary | ICD-10-CM | POA: Insufficient documentation

## 2021-05-01 DIAGNOSIS — Z800 Family history of malignant neoplasm of digestive organs: Secondary | ICD-10-CM | POA: Diagnosis not present

## 2021-05-01 DIAGNOSIS — Z85828 Personal history of other malignant neoplasm of skin: Secondary | ICD-10-CM | POA: Insufficient documentation

## 2021-05-01 LAB — CBC WITH DIFFERENTIAL/PLATELET
Abs Immature Granulocytes: 0.01 10*3/uL (ref 0.00–0.07)
Basophils Absolute: 0 10*3/uL (ref 0.0–0.1)
Basophils Relative: 0 %
Eosinophils Absolute: 0.2 10*3/uL (ref 0.0–0.5)
Eosinophils Relative: 3 %
HCT: 52.1 % — ABNORMAL HIGH (ref 39.0–52.0)
Hemoglobin: 17.3 g/dL — ABNORMAL HIGH (ref 13.0–17.0)
Immature Granulocytes: 0 %
Lymphocytes Relative: 22 %
Lymphs Abs: 1.3 10*3/uL (ref 0.7–4.0)
MCH: 29.7 pg (ref 26.0–34.0)
MCHC: 33.2 g/dL (ref 30.0–36.0)
MCV: 89.4 fL (ref 80.0–100.0)
Monocytes Absolute: 0.6 10*3/uL (ref 0.1–1.0)
Monocytes Relative: 10 %
Neutro Abs: 3.8 10*3/uL (ref 1.7–7.7)
Neutrophils Relative %: 65 %
Platelets: 125 10*3/uL — ABNORMAL LOW (ref 150–400)
RBC: 5.83 MIL/uL — ABNORMAL HIGH (ref 4.22–5.81)
RDW: 14 % (ref 11.5–15.5)
WBC: 5.9 10*3/uL (ref 4.0–10.5)
nRBC: 0 % (ref 0.0–0.2)

## 2021-05-01 LAB — VITAMIN B12: Vitamin B-12: 430 pg/mL (ref 180–914)

## 2021-05-01 NOTE — Progress Notes (Signed)
? ? ? ?Hematology/Oncology Consult note ?[place]  ?[phone] [phone] ? ?Patient Care Team: ?Laurey Morale, MD as PCP - General  ? ?Name of the patient: Sean Mcgee  ?[id number]  ?[birth year]  ? ?Date of visit: 05/01/21 ? 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 ?Chief complaint/ Reason for visit-routine follow-up of thrombocytopenia ? 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  Previously patient was known to have a normal white cell count.  On his recent CBC on 12/04/2020 his white cell count was lower as compared to his baseline at 3.5 with a normal differential and mild neutropenia with an ANC of 2.4.  Platelets were lower at 102 with an H&H of 15.9/47.4.   ?  ?Results of work-up from November 2022 showed a low B12 level of 165.  HIV and hepatitis C testing was negative.  Subsequent B12 levels were normal at 377.  Patient is currently on oral B12. ? ?Interval history-patient is currently doing well and denies any specific complaints at this time ? ?ECOG PS- 0 ?Pain scale- 0 ? ? ?Review of systems- Review of Systems  ?Constitutional:  Negative for chills, fever, malaise/fatigue and weight loss.  ?HENT:  Negative for congestion, ear discharge and nosebleeds.   ?Eyes:  Negative for blurred vision.  ?Respiratory:  Negative for cough, hemoptysis, sputum production, shortness of breath and wheezing.   ?Cardiovascular:  Negative for chest pain, palpitations, orthopnea and claudication.  ?Gastrointestinal:  Negative for abdominal pain, blood in stool, constipation, diarrhea, heartburn, melena, nausea and vomiting.  ?Genitourinary:  Negative for dysuria,  flank pain, frequency, hematuria and urgency.  ?Musculoskeletal:  Negative for back pain, joint pain and myalgias.  ?Skin:  Negative for rash.  ?Neurological:  Negative for dizziness, tingling, focal weakness, seizures, weakness and headaches.  ?Endo/Heme/Allergies:  Does not bruise/bleed easily.  ?Psychiatric/Behavioral:  Negative for depression and suicidal ideas. The patient does not have insomnia.    ? ? ?No Known Allergies ? ? ?Past Medical History:  ?Diagnosis Date  ? ED (erectile dysfunction)   ? GERD (gastroesophageal reflux disease)   ? Hyperlipemia   ? Hypertension   ? Hypothyroid   ? Low testosterone   ? Skin cancer   ? 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 ? ? ?Past Surgical History:  ?Procedure Laterality Date  ? COLONOSCOPY  02/23/2020  ? per Dr. Havery Moros, adenomatous and sessile serrated polyps, repeat in 5 years  ? HERNIA REPAIR    ? inguinal  bilateral  ? POLYPECTOMY    ? VASECTOMY    ? ? ?Social History  ? ?Socioeconomic History  ? Marital status: Single  ?  Spouse name: Not on file  ? Number of children: Not on file  ? Years of education: Not on file  ? Highest education level: Not on file  ?Occupational History  ? Not on file  ?Tobacco Use  ? Smoking status: Never  ? Smokeless tobacco: Never  ?Vaping Use  ? Vaping Use: Never used  ?Substance and Sexual Activity  ? Alcohol use: Yes  ?  Alcohol/week: 6.0 standard drinks  ?  Types: 6 Glasses of wine per week  ?  Comment: glass of wine  ? Drug use: No  ?  Sexual activity: Not on file  ?Other Topics Concern  ? Not on file  ?Social History Narrative  ? Not on file  ? ?Social Determinants of Health  ? ?Financial Resource Strain: Low Risk   ? Difficulty of Paying Living Expenses: Not hard at all  ?Food Insecurity: No Food Insecurity  ? Worried About Charity fundraiser in the Last Year: Never true  ? Ran Out of Food in the Last Year: Never true  ?Transportation Needs: No Transportation Needs  ? Lack of Transportation (Medical): No  ? Lack of Transportation  (Non-Medical): No  ?Physical Activity: Insufficiently Active  ? Days of Exercise per Week: 3 days  ? Minutes of Exercise per Session: 30 min  ?Stress: No Stress Concern Present  ? Feeling of Stress : Not at all  ?Social Connections: Socially Integrated  ? Frequency of Communication with Friends and Family: Three times a week  ? Frequency of Social Gatherings with Friends and Family: Three times a week  ? Attends Religious Services: More than 4 times per year  ? Active Member of Clubs or Organizations: Yes  ? Attends Archivist Meetings: More than 4 times per year  ? Marital Status: Married  ?Intimate Partner Violence: Not At Risk  ? Fear of Current or Ex-Partner: No  ? Emotionally Abused: No  ? Physically Abused: No  ? Sexually Abused: No  ? ? ?Family History  ?Problem Relation Age of Onset  ? Coronary artery disease Other   ? Cancer Other   ?     abdominal  ? Stomach cancer Father   ? Colon cancer Neg Hx   ? Esophageal cancer Neg Hx   ? Rectal cancer Neg Hx   ? Colon polyps Neg Hx   ? ? ? ?Current Outpatient Medications:  ?  benazepril (LOTENSIN) 20 MG tablet, TAKE 1 TABLET BY MOUTH ONCE A DAY, Disp: 90 tablet, Rfl: 0 ?  esomeprazole (NEXIUM) 40 MG capsule, Take 40 mg by mouth daily at 12 noon., Disp: , Rfl:  ?  levothyroxine (SYNTHROID) 150 MCG tablet, TAKE 1 TABLET BY MOUTH ONCE A DAY BEFOREBREAKFAST, Disp: 90 tablet, Rfl: 1 ?  metoprolol succinate (TOPROL-XL) 50 MG 24 hr tablet, TAKE 1 TABLET BY MOUTH ONCE DAILY TAKE WITH OR IMMEDIATELY FOLLOWING A MEAL, Disp: 90 tablet, Rfl: 3 ?  simvastatin (ZOCOR) 40 MG tablet, TAKE 1 TABLET BY MOUTH EVERY OTHER NIGHT AT BEDTIME, Disp: 45 tablet, Rfl: 3 ?  vitamin B-12 (CYANOCOBALAMIN) 500 MCG tablet, Take 500 mcg by mouth daily., Disp: , Rfl:  ?  omeprazole (PRILOSEC) 20 MG capsule, Take 1 capsule (20 mg total) by mouth every other day. (Patient not taking: Reported on 12/18/2020), Disp: 30 capsule, Rfl: 3 ? ?Physical exam:  ?Vitals:  ? 05/01/21 1106  ?BP: (!)  169/90  ?Pulse: 83  ?Resp: 20  ?Temp: 98.7 ?F (37.1 ?C)  ?SpO2: 100%  ?Weight: 183 lb 9.6 oz (83.3 kg)  ? ?Physical Exam ?Cardiovascular:  ?   Rate and Rhythm: Normal rate and regular rhythm.  ?   Heart sounds: Normal heart sounds.  ?Pulmonary:  ?   Effort: Pulmonary effort is normal.  ?   Breath sounds: Normal breath sounds.  ?Skin: ?   General: Skin is warm and dry.  ?Neurological:  ?   Mental Status: He is alert and oriented to person, place, and time.  ?  ? ?CMP Latest Ref Rng & Units 12/04/2020  ?Glucose 70 - 99 mg/dL 87  ?  BUN 6 - 23 mg/dL 14  ?Creatinine 0.40 - 1.50 mg/dL 1.05  ?Sodium 135 - 145 mEq/L 139  ?Potassium 3.5 - 5.1 mEq/L 4.2  ?Chloride 96 - 112 mEq/L 105  ?CO2 19 - 32 mEq/L 27  ?Calcium 8.4 - 10.5 mg/dL 9.3  ?Total Protein 6.0 - 8.3 g/dL 6.9  ?Total Bilirubin 0.2 - 1.2 mg/dL 0.7  ?Alkaline Phos 39 - 117 U/L 60  ?AST 0 - 37 U/L 16  ?ALT 0 - 53 U/L 17  ? ?CBC Latest Ref Rng & Units 05/01/2021  ?WBC 4.0 - 10.5 K/uL 5.9  ?Hemoglobin 13.0 - 17.0 g/dL 17.3(H)  ?Hematocrit 39.0 - 52.0 % 52.1(H)  ?Platelets 150 - 400 K/uL 125(L)  ? ? ? ?Assessment and plan- Patient is a 76 y.o. male who is here for follow-up of following issues: ? ?Thrombocytopenia: Mild with a platelet count that fluctuates between 120s to 130s at least dating back to2016.  Probably secondary to ITP.  Continue to monitor and does not require any active management at this time. ? ?B12 deficiency: Continue oral B12 and we will repeat B12 levels in 6 months.  Levels from today are pending. ? 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  His prior hemoglobins have been normal except on one occasion when it was 17 and subsequently normalized.  This could be spurious polycythemia which can be continue to monitor ? ?Repeat CBC with differential in 6 months in 1 year and I will see him back in 1 year ?  ?Visit Diagnosis ?1. Thrombocytopenia ([place])   ? ? ? ?Dr. Randa Evens, MD, MPH ?[facility] at [facility] ?[phone] ?05/01/2021 ?4:19 PM ? ? ? ? ? ? ?    ? ? ? ? ? ?

## 2021-05-02 ENCOUNTER — Encounter (HOSPITAL_COMMUNITY): Payer: Self-pay | Admitting: Emergency Medicine

## 2021-05-02 ENCOUNTER — Emergency Department (HOSPITAL_COMMUNITY): Payer: Medicare HMO

## 2021-05-02 ENCOUNTER — Emergency Department (HOSPITAL_COMMUNITY)
Admission: EM | Admit: 2021-05-02 | Discharge: 2021-05-02 | Disposition: A | Discharge status: Other Acute Inpatient Hospital | Payer: Medicare HMO | Attending: Emergency Medicine | Admitting: Emergency Medicine

## 2021-05-02 ENCOUNTER — Other Ambulatory Visit: Payer: Self-pay

## 2021-05-02 DIAGNOSIS — R4182 Altered mental status, unspecified: Secondary | ICD-10-CM | POA: Diagnosis not present

## 2021-05-02 DIAGNOSIS — K219 Gastro-esophageal reflux disease without esophagitis: Secondary | ICD-10-CM | POA: Diagnosis not present

## 2021-05-02 DIAGNOSIS — E785 Hyperlipidemia, unspecified: Secondary | ICD-10-CM | POA: Diagnosis not present

## 2021-05-02 DIAGNOSIS — Q2112 Patent foramen ovale: Secondary | ICD-10-CM | POA: Diagnosis not present

## 2021-05-02 DIAGNOSIS — R4701 Aphasia: Secondary | ICD-10-CM

## 2021-05-02 DIAGNOSIS — I63412 Cerebral infarction due to embolism of left middle cerebral artery: Secondary | ICD-10-CM | POA: Diagnosis not present

## 2021-05-02 DIAGNOSIS — I6522 Occlusion and stenosis of left carotid artery: Secondary | ICD-10-CM | POA: Diagnosis not present

## 2021-05-02 DIAGNOSIS — E039 Hypothyroidism, unspecified: Secondary | ICD-10-CM | POA: Diagnosis not present

## 2021-05-02 DIAGNOSIS — L7632 Postprocedural hematoma of skin and subcutaneous tissue following other procedure: Secondary | ICD-10-CM | POA: Diagnosis not present

## 2021-05-02 DIAGNOSIS — I639 Cerebral infarction, unspecified: Secondary | ICD-10-CM | POA: Diagnosis not present

## 2021-05-02 DIAGNOSIS — Z79899 Other long term (current) drug therapy: Secondary | ICD-10-CM | POA: Insufficient documentation

## 2021-05-02 DIAGNOSIS — I6602 Occlusion and stenosis of left middle cerebral artery: Secondary | ICD-10-CM | POA: Diagnosis not present

## 2021-05-02 DIAGNOSIS — I77810 Thoracic aortic ectasia: Secondary | ICD-10-CM | POA: Diagnosis not present

## 2021-05-02 DIAGNOSIS — I6389 Other cerebral infarction: Secondary | ICD-10-CM | POA: Diagnosis not present

## 2021-05-02 DIAGNOSIS — D696 Thrombocytopenia, unspecified: Secondary | ICD-10-CM | POA: Diagnosis not present

## 2021-05-02 DIAGNOSIS — I10 Essential (primary) hypertension: Secondary | ICD-10-CM | POA: Diagnosis not present

## 2021-05-02 DIAGNOSIS — R29704 NIHSS score 4: Secondary | ICD-10-CM

## 2021-05-02 DIAGNOSIS — E538 Deficiency of other specified B group vitamins: Secondary | ICD-10-CM | POA: Diagnosis not present

## 2021-05-02 DIAGNOSIS — I083 Combined rheumatic disorders of mitral, aortic and tricuspid valves: Secondary | ICD-10-CM | POA: Diagnosis not present

## 2021-05-02 DIAGNOSIS — M79604 Pain in right leg: Secondary | ICD-10-CM | POA: Diagnosis not present

## 2021-05-02 DIAGNOSIS — I700 Atherosclerosis of aorta: Secondary | ICD-10-CM | POA: Diagnosis not present

## 2021-05-02 DIAGNOSIS — I088 Other rheumatic multiple valve diseases: Secondary | ICD-10-CM | POA: Diagnosis not present

## 2021-05-02 DIAGNOSIS — I480 Paroxysmal atrial fibrillation: Secondary | ICD-10-CM | POA: Diagnosis not present

## 2021-05-02 DIAGNOSIS — R404 Transient alteration of awareness: Secondary | ICD-10-CM | POA: Diagnosis not present

## 2021-05-02 DIAGNOSIS — Z743 Need for continuous supervision: Secondary | ICD-10-CM | POA: Diagnosis not present

## 2021-05-02 DIAGNOSIS — E7800 Pure hypercholesterolemia, unspecified: Secondary | ICD-10-CM | POA: Diagnosis not present

## 2021-05-02 DIAGNOSIS — R410 Disorientation, unspecified: Secondary | ICD-10-CM | POA: Diagnosis not present

## 2021-05-02 DIAGNOSIS — Z9282 Status post administration of tPA (rtPA) in a different facility within the last 24 hours prior to admission to current facility: Secondary | ICD-10-CM

## 2021-05-02 DIAGNOSIS — I63512 Cerebral infarction due to unspecified occlusion or stenosis of left middle cerebral artery: Secondary | ICD-10-CM | POA: Diagnosis not present

## 2021-05-02 DIAGNOSIS — R29703 NIHSS score 3: Secondary | ICD-10-CM | POA: Diagnosis not present

## 2021-05-02 DIAGNOSIS — I69320 Aphasia following cerebral infarction: Secondary | ICD-10-CM | POA: Diagnosis not present

## 2021-05-02 DIAGNOSIS — Z79890 Hormone replacement therapy (postmenopausal): Secondary | ICD-10-CM | POA: Diagnosis not present

## 2021-05-02 DIAGNOSIS — I6503 Occlusion and stenosis of bilateral vertebral arteries: Secondary | ICD-10-CM | POA: Diagnosis not present

## 2021-05-02 DIAGNOSIS — R531 Weakness: Secondary | ICD-10-CM | POA: Diagnosis not present

## 2021-05-02 DIAGNOSIS — R29810 Facial weakness: Secondary | ICD-10-CM | POA: Diagnosis not present

## 2021-05-02 LAB — I-STAT CHEM 8, ED
BUN: 21 mg/dL (ref 8–23)
Calcium, Ion: 1.17 mmol/L (ref 1.15–1.40)
Chloride: 105 mmol/L (ref 98–111)
Creatinine, Ser: 1.2 mg/dL (ref 0.61–1.24)
Glucose, Bld: 113 mg/dL — ABNORMAL HIGH (ref 70–99)
HCT: 49 % (ref 39.0–52.0)
Hemoglobin: 16.7 g/dL (ref 13.0–17.0)
Potassium: 4.1 mmol/L (ref 3.5–5.1)
Sodium: 141 mmol/L (ref 135–145)
TCO2: 26 mmol/L (ref 22–32)

## 2021-05-02 LAB — DIFFERENTIAL
Abs Immature Granulocytes: 0.02 10*3/uL (ref 0.00–0.07)
Basophils Absolute: 0 10*3/uL (ref 0.0–0.1)
Basophils Relative: 0 %
Eosinophils Absolute: 0.2 10*3/uL (ref 0.0–0.5)
Eosinophils Relative: 4 %
Immature Granulocytes: 0 %
Lymphocytes Relative: 34 %
Lymphs Abs: 1.8 10*3/uL (ref 0.7–4.0)
Monocytes Absolute: 0.5 10*3/uL (ref 0.1–1.0)
Monocytes Relative: 9 %
Neutro Abs: 2.8 10*3/uL (ref 1.7–7.7)
Neutrophils Relative %: 53 %

## 2021-05-02 LAB — COMPREHENSIVE METABOLIC PANEL
ALT: 20 U/L (ref 0–44)
AST: 17 U/L (ref 15–41)
Albumin: 3.8 g/dL (ref 3.5–5.0)
Alkaline Phosphatase: 54 U/L (ref 38–126)
Anion gap: 10 (ref 5–15)
BUN: 18 mg/dL (ref 8–23)
CO2: 23 mmol/L (ref 22–32)
Calcium: 9.1 mg/dL (ref 8.9–10.3)
Chloride: 108 mmol/L (ref 98–111)
Creatinine, Ser: 1.31 mg/dL — ABNORMAL HIGH (ref 0.61–1.24)
GFR, Estimated: 57 mL/min — ABNORMAL LOW (ref >60)
Glucose, Bld: 118 mg/dL — ABNORMAL HIGH (ref 70–99)
Potassium: 4 mmol/L (ref 3.5–5.1)
Sodium: 141 mmol/L (ref 135–145)
Total Bilirubin: 1 mg/dL (ref 0.3–1.2)
Total Protein: 6.9 g/dL (ref 6.5–8.1)

## 2021-05-02 LAB — CBC
HCT: 48.9 % (ref 39.0–52.0)
Hemoglobin: 16.5 g/dL (ref 13.0–17.0)
MCH: 30.5 pg (ref 26.0–34.0)
MCHC: 33.7 g/dL (ref 30.0–36.0)
MCV: 90.4 fL (ref 80.0–100.0)
Platelets: 110 10*3/uL — ABNORMAL LOW (ref 150–400)
RBC: 5.41 MIL/uL (ref 4.22–5.81)
RDW: 14.1 % (ref 11.5–15.5)
WBC: 5.3 10*3/uL (ref 4.0–10.5)
nRBC: 0 % (ref 0.0–0.2)

## 2021-05-02 LAB — PROTIME-INR
INR: 1.2 (ref 0.8–1.2)
Prothrombin Time: 14.9 seconds (ref 11.4–15.2)

## 2021-05-02 LAB — APTT: aPTT: 24 seconds (ref 24–36)

## 2021-05-02 LAB — TSH: TSH: 0.955 u[IU]/mL (ref 0.350–4.500)

## 2021-05-02 LAB — CBG MONITORING, ED: Glucose-Capillary: 123 mg/dL — ABNORMAL HIGH (ref 70–99)

## 2021-05-02 MED ORDER — LABETALOL HCL 5 MG/ML IV SOLN
INTRAVENOUS | Status: AC
  Administered 2021-05-02: 10 mg
  Filled 2021-05-02: qty 4

## 2021-05-02 MED ORDER — IOHEXOL 350 MG/ML SOLN
75.0000 mL | Freq: Once | INTRAVENOUS | Status: AC | PRN
  Administered 2021-05-02: 75 mL via INTRAVENOUS

## 2021-05-02 MED ORDER — TENECTEPLASE FOR STROKE
0.2500 mg/kg | PACK | Freq: Once | INTRAVENOUS | Status: AC
  Administered 2021-05-02: 21 mg via INTRAVENOUS
  Filled 2021-05-02: qty 10

## 2021-05-02 MED ORDER — SODIUM CHLORIDE 0.9% FLUSH
3.0000 mL | Freq: Once | INTRAVENOUS | Status: AC
  Administered 2021-05-02: 3 mL via INTRAVENOUS

## 2021-05-02 NOTE — ED Notes (Signed)
Attempted to call wife, Ashok Norris with update but phone went to voicemail. Left voicemail.  ?

## 2021-05-02 NOTE — ED Provider Notes (Signed)
?[facility] ?Provider Note ? ? ?CSN: [id number] ?Arrival date & time: 05/02/21  0845 ? ?  ? ?History ? ?Chief Complaint  ?Patient presents with  ? Code Stroke  ? ? ?Sean Mcgee is a 76 y.o. male. ? ?This is a 76 y.o. male  with significant medical history as below, including hypertension, hyperlipidemia,Thrombocytopenia (pos ITP- seens onc), hypothyroid who presents to the ED with complaint of stroke like symptoms.  History limited secondary to profound aphasia, patient arrived unaccompanied by family.  Evaluated by stroke team on arrival, sent directly to emergent CT. ? ? ?Past Medical History: ?No date: ED (erectile dysfunction) ?No date: GERD (gastroesophageal reflux disease) ?No date: Hyperlipemia ?No date: Hypertension ?No date: Hypothyroid ?No date: Low testosterone ?No date: Skin cancer ?    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 ?Past Surgical History: ?02/23/2020: COLONOSCOPY ?    Comment:  per Dr. Havery Moros, adenomatous and sessile serrated  ?             polyps, repeat in 5 years ?No date: HERNIA REPAIR ?    Comment:  inguinal  bilateral ?No date: POLYPECTOMY ?No date: VASECTOMY  ? ? ?The history is provided by the patient and the EMS personnel. No language interpreter was used.  ? ?  ? ?Home Medications ?Prior to Admission medications   ?Medication Sig Start Date End Date Taking? Authorizing Provider  ?benazepril (LOTENSIN) 20 MG tablet TAKE 1 TABLET BY MOUTH ONCE A DAY ?Patient taking differently: Take 20 mg by mouth daily. 02/16/21   Laurey Morale, MD  ?esomeprazole (NEXIUM) 40 MG capsule Take 40 mg by mouth daily at 12 noon.    [provider]  ?levothyroxine (SYNTHROID) 150 MCG tablet TAKE 1 TABLET BY MOUTH ONCE A DAY BEFOREBREAKFAST ?Patient taking differently: Take 150 mcg by mouth daily before breakfast. 11/29/20   Laurey Morale, MD  ?metoprolol succinate (TOPROL-XL) 50 MG 24 hr tablet TAKE 1 TABLET BY MOUTH ONCE DAILY TAKE WITH OR IMMEDIATELY  FOLLOWING A MEAL ?Patient taking differently: Take 50 mg by mouth daily. 11/29/20   Laurey Morale, MD  ?omeprazole (PRILOSEC) 20 MG capsule Take 1 capsule (20 mg total) by mouth every other day. ?Patient not taking: Reported on 12/18/2020 11/23/18   Laurey Morale, MD  ?simvastatin (ZOCOR) 40 MG tablet TAKE 1 TABLET BY MOUTH EVERY OTHER NIGHT AT BEDTIME ?Patient taking differently: Take 40 mg by mouth daily at 6 PM. 12/04/20   Laurey Morale, MD  ?vitamin B-12 (CYANOCOBALAMIN) 500 MCG tablet Take 500 mcg by mouth daily.    [provider]  ?   ? ?Allergies    ?Patient has no known allergies.   ? ?Review of Systems   ?Review of Systems  ?Unable to perform ROS: Acuity of condition  ? ?Physical Exam ?Updated Vital Signs ?BP (!) 169/76   Pulse 67   Temp 98.2 ?F (36.8 ?C)   Resp 20   Wt 82 kg   SpO2 100%   BMI 23.21 kg/m?  ?Physical Exam ?Vitals and nursing note reviewed.  ?Constitutional:   ?   Appearance: Normal appearance. He is not toxic-appearing.  ?HENT:  ?   Head: Normocephalic.  ?   Right Ear: External ear normal.  ?   Left Ear: External ear normal.  ?   Nose: Nose normal.  ?   Mouth/Throat:  ?   Mouth: Mucous membranes are moist.  ?Eyes:  ?  General:     ?   Right eye: No discharge.     ?   Left eye: No discharge.  ?   Extraocular Movements: Extraocular movements intact.  ?   Pupils: Pupils are equal, round, and reactive to light.  ?Cardiovascular:  ?   Rate and Rhythm: Normal rate and regular rhythm.  ?   Pulses: Normal pulses.  ?Pulmonary:  ?   Effort: Pulmonary effort is normal.  ?   Breath sounds: Normal breath sounds.  ?Abdominal:  ?   General: Abdomen is flat.  ?   Palpations: Abdomen is soft.  ?   Tenderness: There is no abdominal tenderness.  ?Musculoskeletal:     ?   General: Normal range of motion.  ?   Cervical back: Normal range of motion. No rigidity.  ?   Comments: Sacral decubitus ulcer noted   ?Skin: ?   General: Skin is warm and dry.  ?   Capillary Refill: Capillary refill takes  less than 2 seconds.  ?Neurological:  ?   Mental Status: He is alert. He is disoriented and confused.  ?   GCS: GCS eye subscore is 4. GCS verbal subscore is 4. GCS motor subscore is 6.  ?   Cranial Nerves: Facial asymmetry present.  ?   Motor: Weakness present.  ?   Comments: Right sided weakness, profound aphasia  ? ?Gait not tested 2/2 pt safety  ? ?Variable compliance with neurologic exam.  2/2 profound aphasia  ?Psychiatric:     ?   Mood and Affect: Mood normal.     ?   Behavior: Behavior normal.  ? ? ?ED Results / Procedures / Treatments   ?Labs ?(all labs ordered are listed, but only abnormal results are displayed) ?Labs Reviewed  ?CBC - Abnormal; Notable for the following components:  ?    Result Value  ? Platelets 110 (*)   ? All other components within normal limits  ?COMPREHENSIVE METABOLIC PANEL - Abnormal; Notable for the following components:  ? Glucose, Bld 118 (*)   ? Creatinine, Ser 1.31 (*)   ? GFR, Estimated 57 (*)   ? All other components within normal limits  ?I-STAT CHEM 8, ED - Abnormal; Notable for the following components:  ? Glucose, Bld 113 (*)   ? All other components within normal limits  ?CBG MONITORING, ED - Abnormal; Notable for the following components:  ? Glucose-Capillary 123 (*)   ? All other components within normal limits  ?PROTIME-INR  ?APTT  ?DIFFERENTIAL  ?TSH  ? ? ?EKG ?EKG Interpretation ? ?Date/Time:  Wednesday May 02 2021 09:30:13 EDT ?Ventricular Rate:  64 ?PR Interval:  206 ?QRS Duration: 82 ?QT Interval:  414 ?QTC Calculation: 427 ?R Axis:   74 ?Text Interpretation: Normal sinus rhythm Normal ECG No previous ECGs available Confirmed by Wynona Dove ([id number]) on 05/02/2021 9:39:37 AM ? ?Radiology ?CT HEAD CODE STROKE WO CONTRAST ? ?Result Date: 05/02/2021 ?CLINICAL DATA:  Code stroke.  Right-sided weakness EXAM: CT HEAD WITHOUT CONTRAST TECHNIQUE: Contiguous axial images were obtained from the base of the skull through the vertex without intravenous contrast. RADIATION DOSE  REDUCTION: This exam was performed according to the departmental dose-optimization program which includes automated exposure control, adjustment of the mA and/or kV according to patient size and/or use of iterative reconstruction technique. COMPARISON:  None. FINDINGS: Brain: There is no evidence of acute intracranial hemorrhage, extra-axial fluid collection, or acute infarct. Background parenchymal volume is normal. The ventricles are normal in  size. Satara Virella-white differentiation is preserved. There is no mass lesion. There is no mass effect or midline shift. Vascular: No hyperdense vessel or unexpected calcification. Skull: Normal. Negative for fracture or focal lesion. Sinuses/Orbits: There is soap bubble appearing fluid in the left sphenoid sinus and mild mucosal thickening throughout the remainder of the paranasal sinuses. Bilateral lens implants are in place. The globes and orbits are otherwise unremarkable. Other: None. ASPECTS [facility] Stroke Program Early CT Score) - Ganglionic level infarction (caudate, lentiform nuclei, internal capsule, insula, M1-M3 cortex): 7 - Supraganglionic infarction (M4-M6 cortex): 3 Total score (0-10 with 10 being normal): 10 IMPRESSION: 1. No acute intracranial hemorrhage or infarct. 2. ASPECTS is 10 These results were paged via AMION at the time of interpretation on 05/02/2021 at 9:01 am to provider Dr Theda Sers. Electronically Signed   By: Valetta Mole M.D.   On: 05/02/2021 09:02  ? ?CT ANGIO HEAD NECK W WO CM (CODE STROKE) ? ?Result Date: 05/02/2021 ?CLINICAL DATA:  Right-sided weakness EXAM: CT ANGIOGRAPHY HEAD AND NECK TECHNIQUE: Multidetector CT imaging of the head and neck was performed using the standard protocol during bolus administration of intravenous contrast. Multiplanar CT image reconstructions and MIPs were obtained to evaluate the vascular anatomy. Carotid stenosis measurements (when applicable) are obtained utilizing NASCET criteria, using the distal internal carotid  diameter as the denominator. RADIATION DOSE REDUCTION: This exam was performed according to the departmental dose-optimization program which includes automated exposure control, adjustment of the mA and/or

## 2021-05-02 NOTE — Progress Notes (Deleted)
Neurology Consultation ? ?Reason for Consult: "code stroke" ?Referring Physician: Wynona Dove, DO ? ?CC: Rt sided weakness and speech difficulty ? ?History is obtained from: EMS, chart review, patient ? ?HPI: Sean Mcgee is a 76 y.o. 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  He presents to the [facility] emergency department via EMS for code stroke, with reported symptoms of right-sided weakness and speech difficulty. ? ?Per EMS report, the patient's baseline level of functioning is good according to his wife.  He is normally fully alert and oriented and is able to take care of of ADLs and IADLs.  They report that his last known well was at 7 AM this morning.  He reportedly developed sudden onset weakness and inability to produce speech.  He was staring straight ahead and "grabbing at his face".  He was unable to follow commands. ? ?On assessment this morning, the patient demonstrates severe aphasia with impaired fluency, comprehension, and repetition.  He is unable to state his name or how old he is but instead says "April" over and over again.  He has difficulty following simple commands and is unable to repeat back sentences.  The patient does not demonstrate focal motor deficits.  See below for full neuro exam. ? ? ?LKW: 0700 ?TNK given?: yes ?IR Thrombectomy? Pending  ?Modified Rankin Scale: 0-Completely asymptomatic and back to baseline post- stroke ? ?ROS:  Unable to obtain due to aphasia ? ?Past Medical History:  ?Diagnosis Date  ? ED (erectile dysfunction)   ? GERD (gastroesophageal reflux disease)   ? Hyperlipemia   ? Hypertension   ? Hypothyroid   ? Low testosterone   ? Skin cancer   ? 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 ? ? ?Family History  ?Problem Relation Age of Onset  ? Coronary artery disease Other   ? Cancer Other   ?     abdominal  ? Stomach cancer Father   ? Colon cancer Neg Hx   ? Esophageal cancer Neg Hx   ? Rectal cancer Neg Hx    ? Colon polyps Neg Hx   ? ? ? ?Social History:  ? reports that he has never smoked. He has never used smokeless tobacco. He reports current alcohol use of about 6.0 standard drinks per week. He reports that he does not use drugs. ? ?Medications ?No current facility-administered medications for this encounter. ? ?Current Outpatient Medications:  ?  benazepril (LOTENSIN) 20 MG tablet, TAKE 1 TABLET BY MOUTH ONCE A DAY (Patient taking differently: Take 20 mg by mouth daily.), Disp: 90 tablet, Rfl: 0 ?  esomeprazole (NEXIUM) 40 MG capsule, Take 40 mg by mouth daily at 12 noon., Disp: , Rfl:  ?  levothyroxine (SYNTHROID) 150 MCG tablet, TAKE 1 TABLET BY MOUTH ONCE A DAY BEFOREBREAKFAST (Patient taking differently: Take 150 mcg by mouth daily before breakfast.), Disp: 90 tablet, Rfl: 1 ?  metoprolol succinate (TOPROL-XL) 50 MG 24 hr tablet, TAKE 1 TABLET BY MOUTH ONCE DAILY TAKE WITH OR IMMEDIATELY FOLLOWING A MEAL, Disp: 90 tablet, Rfl: 3 ?  omeprazole (PRILOSEC) 20 MG capsule, Take 1 capsule (20 mg total) by mouth every other day. (Patient not taking: Reported on 12/18/2020), Disp: 30 capsule, Rfl: 3 ?  simvastatin (ZOCOR) 40 MG tablet, TAKE 1 TABLET BY MOUTH EVERY OTHER NIGHT AT BEDTIME, Disp: 45 tablet, Rfl: 3 ?  vitamin B-12 (CYANOCOBALAMIN) 500 MCG tablet, Take 500 mcg by mouth  daily., Disp: , Rfl:  ? ? ?Exam: ?Current vital signs: ?BP (!) 177/88   Pulse 67   Temp 98.2 ?F (36.8 ?C) (Oral)   Wt 82 kg   SpO2 100%   BMI 23.21 kg/m?  ?Vital signs in last 24 hours: ?Temp:  [98.2 ?F (36.8 ?C)-98.7 ?F (37.1 ?C)] 98.2 ?F (36.8 ?C) (03/22 0920) ?Pulse Rate:  [67-83] 67 (03/22 0928) ?Resp:  [20] 20 (03/21 1106) ?BP: (169-179)/(76-90) 177/88 (03/22 1610) ?SpO2:  [100 %] 100 % (03/22 0920) ?Weight:  [82 kg-83.3 kg] 82 kg (03/22 0900) ? ?GENERAL: Awake, alert, unable to answer orientation questions ?Head: Normocephalic and atraumatic, without obvious abnormality ?EENT: Normal conjunctivae, dry mucous membranes, no OP  obstruction ?LUNGS: Normal respiratory effort. Non-labored breathing on room air ?CV: Regular rate and rhythm on telemetry ?Extremities: warm, well perfused, without obvious deformity ? ?NEURO:  ?Mental Status: Awake, alert, unable to answer orientation questions ?No neglect is noted ?Cranial Nerves:  ?II: PERRL,  visual fields full.  ?III, IV, VI: EOMI. Lid elevation symmetric and full.  ?V: unable to test facial sensation. ?VII: mild facial droop ?VIII: Hearing intact to voice ?IX, X: untested ?XI: unable to test ?XII: unable to test ?Motor: 5/5 strength is all muscle groups.  ?Tone is normal. Bulk is normal.  ?Sensation: unable to test ?Coordination: patient unable to follow commands ?DTRs: 2+ throughout.  ?Gait: Deferred ? ?NIHSS: ?1a Level of Consciousness: 0 ?1b LOC Questions: 2 ?1c LOC Commands: 2 ?2 Best Gaze: 0 ?3 Visual: 0 ?4 Facial Palsy: 1  ?5a Motor Arm - left: 0   ?5b Motor Arm - Right: 0 ?6a Motor Leg - Left: 0 ?6b Motor Leg - Right: 0 ?7 Limb Ataxia: 0 ?8 Sensory: 0 ?9 Best Language: 2 ?10 Dysarthria: 0 ?11 Extinct. and Inattention: 0  ?TOTAL: 7 ? ? ?Labs ?I have reviewed labs in epic and the results pertinent to this consultation are: ?Cr of 1.2, plts 110, CBG 123 ? ?CBC ?   ?Component Value Date/Time  ? WBC 5.3 05/02/2021 0850  ? RBC 5.41 05/02/2021 0850  ? HGB 16.7 05/02/2021 0851  ? HCT 49.0 05/02/2021 0851  ? PLT 110 (L) 05/02/2021 0850  ? MCV 90.4 05/02/2021 0850  ? MCH 30.5 05/02/2021 0850  ? MCHC 33.7 05/02/2021 0850  ? RDW 14.1 05/02/2021 0850  ? LYMPHSABS 1.8 05/02/2021 0850  ? MONOABS 0.5 05/02/2021 0850  ? EOSABS 0.2 05/02/2021 0850  ? BASOSABS 0.0 05/02/2021 0850  ? ? ?CMP  ?   ?Component Value Date/Time  ? NA 141 05/02/2021 0851  ? K 4.1 05/02/2021 0851  ? CL 105 05/02/2021 0851  ? CO2 27 12/04/2020 1025  ? GLUCOSE 113 (H) 05/02/2021 0851  ? BUN 21 05/02/2021 0851  ? CREATININE 1.20 05/02/2021 0851  ? CREATININE 1.05 11/24/2019 1029  ? CALCIUM 9.3 12/04/2020 1025  ? PROT 6.9 12/04/2020  1025  ? ALBUMIN 4.2 12/04/2020 1025  ? AST 16 12/04/2020 1025  ? ALT 17 12/04/2020 1025  ? ALKPHOS 60 12/04/2020 1025  ? BILITOT 0.7 12/04/2020 1025  ? GFRNONAA 91.70 08/04/2009 0830  ? GFRAA 88 07/15/2007 0946  ? ? ?Lipid Panel  ?   ?Component Value Date/Time  ? CHOL 143 12/04/2020 1025  ? TRIG 57.0 12/04/2020 1025  ? HDL 54.40 12/04/2020 1025  ? CHOLHDL 3 12/04/2020 1025  ? VLDL 11.4 12/04/2020 1025  ? LDLCALC 77 12/04/2020 1025  ? LDLCALC 133 (H) 11/24/2019 1029  ? ? ? ?Imaging ?  I have reviewed the images obtained: ? ?CT-scan of the brain: ?No acute hemorrhage or infarct ? ?CTA-Head and Neck: ?Occlusion of an left inferior M2 ? ?Assessment:  ?76 year old male with a past history noted above, presenting with aphasia, found to have an occlusion of the left inferior M2.  Patient given TNK and will continue to be reassessed. ? ?Impression: Acute ischemic stroke ? ?Recommendations: ?To follow.  ?Likely transfer to outside facility due to backlog of cases at [facility] ? ? ?Corky Sox, MD ?PGY-1 ? ? ? ?  ?

## 2021-05-02 NOTE — Consult Note (Signed)
Neurology Consultation ? ?Reason for Consult: "code stroke" ?Referring Physician: Wynona Dove, DO ? ?CC: Rt sided weakness and speech difficulty ? ?History is obtained from: EMS, chart review, patient ? ?HPI: Sean Mcgee is a 76 y.o. 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  He presents to the [facility] emergency department via EMS for code stroke, with reported symptoms of right-sided weakness and speech difficulty. ? ?Per EMS report, the patient's baseline level of functioning is good according to his wife.  He is normally fully alert and oriented and is able to take care of of ADLs and IADLs.  They report that his last known well was at 7 AM this morning.  He reportedly developed sudden onset weakness and inability to produce speech.  He was staring straight ahead and "grabbing at his face".  He was unable to follow commands. ? ?On assessment this morning, the patient demonstrates severe aphasia with impaired fluency, comprehension, and repetition.  He is unable to state his name or how old he is but instead says "April" over and over again.  He has difficulty following simple commands and is unable to repeat back sentences.  The patient does not demonstrate focal motor deficits.  See below for full neuro exam. ? ? ?LKW: 0700 ?TNK given?: yes ?IR Thrombectomy? Pending  ?Modified Rankin Scale: 0-Completely asymptomatic and back to baseline post- stroke ? ?ROS:  Unable to obtain due to aphasia ? ?Past Medical History:  ?Diagnosis Date  ? ED (erectile dysfunction)   ? GERD (gastroesophageal reflux disease)   ? Hyperlipemia   ? Hypertension   ? Hypothyroid   ? Low testosterone   ? Skin cancer   ? 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 ? ? ?Family History  ?Problem Relation Age of Onset  ? Coronary artery disease Other   ? Cancer Other   ?     abdominal  ? Stomach cancer Father   ? Colon cancer Neg Hx   ? Esophageal cancer Neg Hx   ? Rectal cancer Neg Hx    ? Colon polyps Neg Hx   ? ? ? ?Social History:  ? reports that he has never smoked. He has never used smokeless tobacco. He reports current alcohol use of about 6.0 standard drinks per week. He reports that he does not use drugs. ? ?Medications ?No current facility-administered medications for this encounter. ? ?Current Outpatient Medications:  ?  benazepril (LOTENSIN) 20 MG tablet, TAKE 1 TABLET BY MOUTH ONCE A DAY (Patient taking differently: Take 20 mg by mouth daily.), Disp: 90 tablet, Rfl: 0 ?  esomeprazole (NEXIUM) 40 MG capsule, Take 40 mg by mouth daily at 12 noon., Disp: , Rfl:  ?  levothyroxine (SYNTHROID) 150 MCG tablet, TAKE 1 TABLET BY MOUTH ONCE A DAY BEFOREBREAKFAST (Patient taking differently: Take 150 mcg by mouth daily before breakfast.), Disp: 90 tablet, Rfl: 1 ?  metoprolol succinate (TOPROL-XL) 50 MG 24 hr tablet, TAKE 1 TABLET BY MOUTH ONCE DAILY TAKE WITH OR IMMEDIATELY FOLLOWING A MEAL, Disp: 90 tablet, Rfl: 3 ?  omeprazole (PRILOSEC) 20 MG capsule, Take 1 capsule (20 mg total) by mouth every other day. (Patient not taking: Reported on 12/18/2020), Disp: 30 capsule, Rfl: 3 ?  simvastatin (ZOCOR) 40 MG tablet, TAKE 1 TABLET BY MOUTH EVERY OTHER NIGHT AT BEDTIME, Disp: 45 tablet, Rfl: 3 ?  vitamin B-12 (CYANOCOBALAMIN) 500 MCG tablet, Take 500 mcg by mouth  daily., Disp: , Rfl:  ? ? ?Exam: ?Current vital signs: ?BP (!) 177/88   Pulse 67   Temp 98.2 ?F (36.8 ?C) (Oral)   Wt 82 kg   SpO2 100%   BMI 23.21 kg/m?  ?Vital signs in last 24 hours: ?Temp:  [98.2 ?F (36.8 ?C)-98.7 ?F (37.1 ?C)] 98.2 ?F (36.8 ?C) (03/22 0920) ?Pulse Rate:  [67-83] 67 (03/22 0928) ?Resp:  [20] 20 (03/21 1106) ?BP: (169-179)/(76-90) 177/88 (03/22 3295) ?SpO2:  [100 %] 100 % (03/22 0920) ?Weight:  [82 kg-83.3 kg] 82 kg (03/22 0900) ? ?GENERAL: Awake, alert, unable to answer orientation questions ?Head: Normocephalic and atraumatic, without obvious abnormality ?EENT: Normal conjunctivae, dry mucous membranes, no OP  obstruction ?LUNGS: Normal respiratory effort. Non-labored breathing on room air ?CV: Regular rate and rhythm on telemetry ?Extremities: warm, well perfused, without obvious deformity ? ?NEURO:  ?Mental Status: Awake, alert, unable to answer orientation questions ?No neglect is noted ?Cranial Nerves:  ?II: PERRL,  visual fields full.  ?III, IV, VI: EOMI. Lid elevation symmetric and full.  ?V: unable to test facial sensation. ?VII: mild facial droop ?VIII: Hearing intact to voice ?IX, X: untested ?XI: unable to test ?XII: unable to test ?Motor: 5/5 strength is all muscle groups.  ?Tone is normal. Bulk is normal.  ?Sensation: unable to test ?Coordination: patient unable to follow commands ?DTRs: 2+ throughout.  ?Gait: Deferred ? ?NIHSS: ?1a Level of Consciousness: 0 ?1b LOC Questions: 2 ?1c LOC Commands: 2 ?2 Best Gaze: 0 ?3 Visual: 0 ?4 Facial Palsy: 1  ?5a Motor Arm - left: 0   ?5b Motor Arm - Right: 0 ?6a Motor Leg - Left: 0 ?6b Motor Leg - Right: 0 ?7 Limb Ataxia: 0 ?8 Sensory: 0 ?9 Best Language: 2 ?10 Dysarthria: 0 ?11 Extinct. and Inattention: 0  ?TOTAL: 7 ? ? ?Labs ?I have reviewed labs in epic and the results pertinent to this consultation are: ?Cr of 1.2, plts 110, CBG 123 ? ?CBC ?   ?Component Value Date/Time  ? WBC 5.3 05/02/2021 0850  ? RBC 5.41 05/02/2021 0850  ? HGB 16.7 05/02/2021 0851  ? HCT 49.0 05/02/2021 0851  ? PLT 110 (L) 05/02/2021 0850  ? MCV 90.4 05/02/2021 0850  ? MCH 30.5 05/02/2021 0850  ? MCHC 33.7 05/02/2021 0850  ? RDW 14.1 05/02/2021 0850  ? LYMPHSABS 1.8 05/02/2021 0850  ? MONOABS 0.5 05/02/2021 0850  ? EOSABS 0.2 05/02/2021 0850  ? BASOSABS 0.0 05/02/2021 0850  ? ? ?CMP  ?   ?Component Value Date/Time  ? NA 141 05/02/2021 0851  ? K 4.1 05/02/2021 0851  ? CL 105 05/02/2021 0851  ? CO2 27 12/04/2020 1025  ? GLUCOSE 113 (H) 05/02/2021 0851  ? BUN 21 05/02/2021 0851  ? CREATININE 1.20 05/02/2021 0851  ? CREATININE 1.05 11/24/2019 1029  ? CALCIUM 9.3 12/04/2020 1025  ? PROT 6.9 12/04/2020  1025  ? ALBUMIN 4.2 12/04/2020 1025  ? AST 16 12/04/2020 1025  ? ALT 17 12/04/2020 1025  ? ALKPHOS 60 12/04/2020 1025  ? BILITOT 0.7 12/04/2020 1025  ? GFRNONAA 91.70 08/04/2009 0830  ? GFRAA 88 07/15/2007 0946  ? ? ?Lipid Panel  ?   ?Component Value Date/Time  ? CHOL 143 12/04/2020 1025  ? TRIG 57.0 12/04/2020 1025  ? HDL 54.40 12/04/2020 1025  ? CHOLHDL 3 12/04/2020 1025  ? VLDL 11.4 12/04/2020 1025  ? LDLCALC 77 12/04/2020 1025  ? LDLCALC 133 (H) 11/24/2019 1029  ? ? ? ?Imaging ?  I have reviewed the images obtained: ? ?CT-scan of the brain: ?No acute hemorrhage or infarct ? ?CTA-Head and Neck: ?Occlusion of an left inferior M2 ? ?Assessment:  ?76 year old male with a past history noted above, presenting with aphasia, found to have an occlusion of the left inferior M2.  Patient given TNK and will continue to be reassessed. ? ?Impression: Acute ischemic stroke ? ?Recommendations: ?To follow.  ?Likely transfer to outside facility due to backlog of cases at [facility] ? ? ?Corky Sox, MD ?PGY-1 ?

## 2021-05-02 NOTE — Code Documentation (Signed)
Stroke Response Nurse Documentation ?Code Documentation ? ?Sean Mcgee is a 76 y.o. male arriving to [facility]  via [facility] on 05/02/21 with past medical hx of HTN, HLD. On No antithrombotic. Code stroke was activated by EMS.  ? ?Patient from home where he was LKW at 0700 when he woke up and took a shower. After the shower around 0800 he suddenly became extremely weak on the right side and fell onto the bed with difficulty speaking and following commands. According to EMS symptoms improved en route and upon arrival patients weakness had resolved. ? ?Stroke team at the bedside on patient arrival. Labs drawn and patient cleared for CT by Dr. Pearline Cables. Patient to CT with team. NIHSS 4, see documentation for details and code stroke times. Patient with right facial droop and Expressive aphasia  on exam. The following imaging was completed:  CT Head and CTA. Patient is a candidate for IV Thrombolytic and TNK received at 0858. Patient is not a candidate for IR due to imaging results.  ? ?Dr. Theda Sers discussed with IR at [facility] who were unable to bring patient back and suggested transfer to [facility]. Patient still with NIHSS 4 and '10mg'$  Labetalol given prior to transportation. Patient transported to [place] via [place] after physician-physician discussion. Report given to [facility] RN and ED RN at [place].  ? ? ?Meda Klinefelter  ?Stroke Response RN ? ? ?

## 2021-05-02 NOTE — ED Triage Notes (Signed)
Pt BIB [facility] from home c/o a code stroke. Pt woke up at 7am and showered. At 8am pt went back into the bedroom stumbled fell face first. Pt was unable to follow commands initially with right sided weakness and aphasia. Pt is not on blood thinners.  ?

## 2021-05-02 NOTE — Progress Notes (Signed)
PHARMACIST CODE STROKE RESPONSE ? ?Notified to mix TNK at 0854 by Dr. Theda Sers ?Delivered TNK to RN at [phone] ? ?TNK dose = 4.2 mg IV over 5 seconds ? ?Issues/delays encountered (if applicable): BP elevated per EMS report, rechecked BP in CT scanner to concern TNK appropriate. No need to give BP lower agents. ? ?Thank you for allowing pharmacy to participate in this patient's care. ? ?Levonne Spiller, PharmD ?PGY1 Acute Care Resident  ?05/02/2021,9:05 AM ? ? ?

## 2021-05-03 ENCOUNTER — Other Ambulatory Visit: Payer: Medicare HMO

## 2021-05-03 DIAGNOSIS — E785 Hyperlipidemia, unspecified: Secondary | ICD-10-CM | POA: Diagnosis not present

## 2021-05-03 DIAGNOSIS — K219 Gastro-esophageal reflux disease without esophagitis: Secondary | ICD-10-CM | POA: Diagnosis not present

## 2021-05-03 DIAGNOSIS — D696 Thrombocytopenia, unspecified: Secondary | ICD-10-CM | POA: Diagnosis not present

## 2021-05-03 DIAGNOSIS — I10 Essential (primary) hypertension: Secondary | ICD-10-CM | POA: Diagnosis not present

## 2021-05-03 DIAGNOSIS — I088 Other rheumatic multiple valve diseases: Secondary | ICD-10-CM | POA: Diagnosis not present

## 2021-05-03 DIAGNOSIS — E538 Deficiency of other specified B group vitamins: Secondary | ICD-10-CM | POA: Diagnosis not present

## 2021-05-03 DIAGNOSIS — I77810 Thoracic aortic ectasia: Secondary | ICD-10-CM | POA: Diagnosis not present

## 2021-05-03 DIAGNOSIS — I63512 Cerebral infarction due to unspecified occlusion or stenosis of left middle cerebral artery: Secondary | ICD-10-CM | POA: Diagnosis not present

## 2021-05-03 DIAGNOSIS — E039 Hypothyroidism, unspecified: Secondary | ICD-10-CM | POA: Diagnosis not present

## 2021-05-04 ENCOUNTER — Telehealth: Payer: Medicare HMO | Admitting: Oncology

## 2021-05-04 DIAGNOSIS — I63512 Cerebral infarction due to unspecified occlusion or stenosis of left middle cerebral artery: Secondary | ICD-10-CM | POA: Diagnosis not present

## 2021-05-05 DIAGNOSIS — Q2112 Patent foramen ovale: Secondary | ICD-10-CM | POA: Diagnosis not present

## 2021-05-05 DIAGNOSIS — I639 Cerebral infarction, unspecified: Secondary | ICD-10-CM | POA: Diagnosis not present

## 2021-05-06 DIAGNOSIS — E785 Hyperlipidemia, unspecified: Secondary | ICD-10-CM | POA: Diagnosis not present

## 2021-05-06 DIAGNOSIS — K219 Gastro-esophageal reflux disease without esophagitis: Secondary | ICD-10-CM | POA: Diagnosis not present

## 2021-05-06 DIAGNOSIS — I10 Essential (primary) hypertension: Secondary | ICD-10-CM | POA: Diagnosis not present

## 2021-05-06 DIAGNOSIS — E039 Hypothyroidism, unspecified: Secondary | ICD-10-CM | POA: Diagnosis not present

## 2021-05-06 DIAGNOSIS — I63412 Cerebral infarction due to embolism of left middle cerebral artery: Secondary | ICD-10-CM | POA: Diagnosis not present

## 2021-05-09 DIAGNOSIS — Z8673 Personal history of transient ischemic attack (TIA), and cerebral infarction without residual deficits: Secondary | ICD-10-CM | POA: Diagnosis not present

## 2021-05-09 DIAGNOSIS — I10 Essential (primary) hypertension: Secondary | ICD-10-CM | POA: Diagnosis not present

## 2021-05-09 DIAGNOSIS — I480 Paroxysmal atrial fibrillation: Secondary | ICD-10-CM | POA: Diagnosis not present

## 2021-05-09 DIAGNOSIS — E7800 Pure hypercholesterolemia, unspecified: Secondary | ICD-10-CM | POA: Diagnosis not present

## 2021-05-11 ENCOUNTER — Encounter: Payer: Self-pay | Admitting: Family Medicine

## 2021-05-11 ENCOUNTER — Ambulatory Visit (INDEPENDENT_AMBULATORY_CARE_PROVIDER_SITE_OTHER): Payer: Medicare HMO | Admitting: Family Medicine

## 2021-05-11 VITALS — BP 128/64 | HR 66 | Temp 98.8°F | Wt 179.6 lb

## 2021-05-11 DIAGNOSIS — I10 Essential (primary) hypertension: Secondary | ICD-10-CM

## 2021-05-11 DIAGNOSIS — Q2112 Patent foramen ovale: Secondary | ICD-10-CM

## 2021-05-11 DIAGNOSIS — I480 Paroxysmal atrial fibrillation: Secondary | ICD-10-CM | POA: Diagnosis not present

## 2021-05-11 DIAGNOSIS — I63412 Cerebral infarction due to embolism of left middle cerebral artery: Secondary | ICD-10-CM | POA: Diagnosis not present

## 2021-05-11 MED ORDER — BENAZEPRIL HCL 40 MG PO TABS: 40.0000 mg | ORAL_TABLET | Freq: Every day | ORAL | 3 refills | Status: DC

## 2021-05-11 NOTE — Progress Notes (Signed)
? ?  Subjective:  ? ? Patient ID: Sean Mcgee, male    DOB: [birth year], 76 y.o.   MRN: [id number] ? ?HPI ?Here with his wife to follow up a hospital stay at [facility] in [place] from 05-02-21 to 05-06-21 for a stroke. He presented with drooping on the right side of his face, right arm weakness, and trouble speaking. An MRI revealed an embolus in the left MCA with involvement of the left parietal lobe. He was given IV TNK in the ER, and his symptoms very quickly resolved. At first no source for the embolus was found, but at one point he was found to be in atrial fibrillation. He had several brief episodes of this, and he was diagnosed with paroxysmal atrial fib. He then had a TEE which showed a small PFO with right to left shunt and an EF of 65-70%. A follow up brain MRA showed that the left ICA had totally recanalized. Prior to going home he was started on Eliquis and his Simvastatin was changed to Atorvastatin. His heart rate has been stable, but his BP at home has often been in the 135-145 range over 60s. He has felt one episode since going home of palpitations in his chest that he recognozed as atrial fibrillation. This lasted about 10 minutes and then it stopped. No SOB or chest pain. He was instructed to follow up with his PCP, but no follow ups with Neurology or Cardiology were recommended.  ? ? ?Review of Systems  ?Constitutional: Negative.   ?Respiratory: Negative.    ?Cardiovascular:  Positive for palpitations. Negative for chest pain and leg swelling.  ?Gastrointestinal: Negative.   ?Neurological: Negative.   ? ?   ?Objective:  ? Physical Exam ?Constitutional:   ?   General: He is not in acute distress. ?   Appearance: Normal appearance.  ?Cardiovascular:  ?   Rate and Rhythm: Normal rate and regular rhythm.  ?   Pulses: Normal pulses.  ?   Heart sounds: Normal heart sounds. No murmur heard. ?Pulmonary:  ?   Effort: Pulmonary effort is normal.  ?   Breath sounds: Normal breath sounds.  ?Neurological:  ?    General: No focal deficit present.  ?   Mental Status: He is alert and oriented to person, place, and time. Mental status is at baseline.  ? ? ? ? ? ?   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 He is now on Eliquis for life as well as a statin. The PAF seems to be a minor issue at this point. He is on a beta blocker. For the HTN we will increase the Benazepril to 40 mg daily. He will follow up with [place] in 4 weeks. We spent a total of ( 35  ) minutes reviewing records and discussing these issues.  ?Alysia Penna, MD ? ? ? ?

## 2021-05-14 DIAGNOSIS — S46002A Unspecified injury of muscle(s) and tendon(s) of the rotator cuff of left shoulder, initial encounter: Secondary | ICD-10-CM | POA: Diagnosis not present

## 2021-05-14 DIAGNOSIS — M25512 Pain in left shoulder: Secondary | ICD-10-CM | POA: Diagnosis not present

## 2021-05-17 ENCOUNTER — Other Ambulatory Visit: Payer: Self-pay | Admitting: Family Medicine

## 2021-05-22 ENCOUNTER — Ambulatory Visit (INDEPENDENT_AMBULATORY_CARE_PROVIDER_SITE_OTHER): Payer: Medicare HMO | Admitting: Family Medicine

## 2021-05-22 ENCOUNTER — Encounter: Payer: Self-pay | Admitting: Family Medicine

## 2021-05-22 VITALS — BP 108/78 | HR 54 | Temp 98.4°F | Wt 177.2 lb

## 2021-05-22 DIAGNOSIS — H6122 Impacted cerumen, left ear: Secondary | ICD-10-CM

## 2021-05-22 MED ORDER — ATORVASTATIN CALCIUM 40 MG PO TABS: 40.0000 mg | ORAL_TABLET | Freq: Every day | ORAL | 3 refills | Status: DC

## 2021-05-22 MED ORDER — APIXABAN 5 MG PO TABS: 5.0000 mg | ORAL_TABLET | Freq: Two times a day (BID) | ORAL | 3 refills | Status: DC

## 2021-05-22 NOTE — Progress Notes (Signed)
? ?  Subjective:  ? ? Patient ID: Sean Mcgee, male    DOB: 1945/04/25, 76 y.o.   MRN: [id number] ? ?HPI ?Here for hearing loss in the left ear for the past 3 days. No pain. No sinus congestion.  ? ? ?Review of Systems  ?Constitutional: Negative.   ?HENT:  Positive for hearing loss. Negative for congestion, ear pain, postnasal drip, sinus pressure and sore throat.   ?Eyes: Negative.   ?Respiratory: Negative.    ? ?   ?Objective:  ? Physical Exam ?Constitutional:   ?   General: He is not in acute distress. ?   Appearance: Normal appearance.  ?HENT:  ?   Right Ear: Tympanic membrane, ear canal and external ear normal.  ?   Left Ear: There is impacted cerumen.  ?   Nose: Nose normal.  ?   Mouth/Throat:  ?   Pharynx: Oropharynx is clear.  ?Eyes:  ?   Conjunctiva/sclera: Conjunctivae normal.  ?Pulmonary:  ?   Effort: Pulmonary effort is normal.  ?   Breath sounds: Normal breath sounds.  ?Lymphadenopathy:  ?   Cervical: No cervical adenopathy.  ?Neurological:  ?   Mental Status: He is alert.  ? ? ? ? ? ?   ?Assessment & Plan:  ?Cerumen impaction. After obtaining informed consent, we irrigated the ear canal with water. This was not successful, but we were able to completely remove the cerumen with a speculum. He tolerated the procedure well, and afterwards he could hear normally.  ?Alysia Penna, MD ? ? ?

## 2021-06-12 ENCOUNTER — Ambulatory Visit (INDEPENDENT_AMBULATORY_CARE_PROVIDER_SITE_OTHER): Payer: Medicare HMO | Admitting: Family Medicine

## 2021-06-12 ENCOUNTER — Encounter: Payer: Self-pay | Admitting: Family Medicine

## 2021-06-12 VITALS — BP 146/78 | HR 49 | Temp 97.8°F | Wt 180.4 lb

## 2021-06-12 DIAGNOSIS — I480 Paroxysmal atrial fibrillation: Secondary | ICD-10-CM | POA: Diagnosis not present

## 2021-06-12 DIAGNOSIS — I10 Essential (primary) hypertension: Secondary | ICD-10-CM

## 2021-06-12 MED ORDER — BENAZEPRIL HCL 40 MG PO TABS: 40.0000 mg | ORAL_TABLET | Freq: Every day | ORAL | 3 refills | Status: DC

## 2021-06-12 NOTE — Progress Notes (Signed)
? ?  Subjective:  ? ? Patient ID: JASEN HARTSTEIN, male    DOB: [birth year], 76 y.o.   MRN: [id number] ? ?HPI ?Here to follow up on HTN and atrial fibrillation. He feels great. A few weeks ago we increased the benazepril to 40 mg daily, and his BP has been quite stable at home. His BP has averaged in the 110s to 120s over 70s. He notes that after he left the hospital he was never assigned a cardiologist.  ? ? ?Review of Systems  ?Constitutional: Negative.   ?Respiratory: Negative.    ?Cardiovascular: Negative.   ? ?   ?Objective:  ? Physical Exam ?Constitutional:   ?   Appearance: Normal appearance.  ?Cardiovascular:  ?   Rate and Rhythm: Normal rate and regular rhythm.  ?   Pulses: Normal pulses.  ?   Heart sounds: Normal heart sounds.  ?Pulmonary:  ?   Effort: Pulmonary effort is normal.  ?   Breath sounds: Normal breath sounds.  ?Neurological:  ?   Mental Status: He is alert.  ? ? ? ? ? ?   ?Assessment & Plan:  ?His HTN is now well controlled. His atrial fib is stable. We will refer him to [facility] to follow him with [place].  ?Alysia Penna, MD ? ? ?

## 2021-06-25 DIAGNOSIS — S46002A Unspecified injury of muscle(s) and tendon(s) of the rotator cuff of left shoulder, initial encounter: Secondary | ICD-10-CM | POA: Diagnosis not present

## 2021-06-26 NOTE — Progress Notes (Signed)
Cardiology Office Note  Date:  06/29/2021   ID:  Sean Mcgee, Sean Mcgee [birth year], MRN [id number]  PCP:  Laurey Morale, MD   Chief Complaint  Sean Mcgee presents with   [name] (Initial Visit)    Ref by Dr. Alysia Penna for PAF. "Doing well." Medications reviewed by the Sean Mcgee verbally.     HPI:  Sean Mcgee is a 76 year old gentleman with past medical history of Hypertension Thrombocytopenia Paroxysmal atrial fibrillation noted while in the hospital Hyperlipidemia PFO Stroke March 2023, aphasia, secondary to embolism of left middle cerebral artery Referred by Dr. Alysia Penna for paroxysmal atrial fibrillation  Hospital records reviewed from March 2023 s/p Tenectaplase at [facility]  thrombectomy for left M2 occlusion and was transferred to [facility] for IR intervention.  status post cerebral angiogram done by Dr. Bryon Lions which showed no large vessel occlusion identified.Taken to angio suite with NIH 3 for isolated aphasia and Angio showed recanalization of the vessel. MRI of head without IV contrast done on 05/03/2021 showed small burden of acute to subacute infarction within the left parietal lobe.  Transthoracic Echocardiogram done on 05/03/2021 and TEE done on 05/04/2021 : Left ventricular ejection fraction was 65-70%.There is no thrombus in the cavity or appendage. Injection of agitated saline documents an interatrial shunt under basal conditions.PFO with positive bubble study. 1+ AI/TR/MR/PI. Irregular heart rhythm, paroxysmal afib noted at [facility]  Started on Eliquis at discharge with Lipitor 40 Continued on benazepril metoprolol Benazepril dose recently increased up to 40 daily for high blood pressure Despite this change she reports systolic pressure still running 140s sometimes up to 802 systolic  Having rare episodes of paroxysmal atrial fibrillation  EKG personally reviewed by myself on todays visit NSR rate 58 bpm no significant ST-T wave  changes   PMH:   has a past medical history of ED (erectile dysfunction), GERD (gastroesophageal reflux disease), Hyperlipemia, Hypertension, Hypothyroid, Low testosterone, and Skin cancer.  PSH:    Past Surgical History:  Procedure Laterality Date   COLONOSCOPY  02/23/2020   per Dr. Havery Moros, adenomatous and sessile serrated polyps, repeat in 5 years   HERNIA REPAIR     inguinal  bilateral   POLYPECTOMY     VASECTOMY      Current Outpatient Medications  Medication Sig Dispense Refill   apixaban (ELIQUIS) 5 MG TABS tablet Take 1 tablet (5 mg total) by mouth 2 (two) times daily. 180 tablet 3   atorvastatin (LIPITOR) 40 MG tablet Take 1 tablet (40 mg total) by mouth daily. 90 tablet 3   benazepril (LOTENSIN) 40 MG tablet Take 1 tablet (40 mg total) by mouth daily. 90 tablet 3   levothyroxine (SYNTHROID) 150 MCG tablet TAKE 1 TABLET BY MOUTH ONCE A DAY BEFOREBREAKFAST 90 tablet 1   metoprolol succinate (TOPROL-XL) 50 MG 24 hr tablet TAKE 1 TABLET BY MOUTH ONCE DAILY TAKE WITH OR IMMEDIATELY FOLLOWING A MEAL 90 tablet 3   omeprazole (PRILOSEC) 20 MG capsule Take 1 capsule (20 mg total) by mouth every other day. 30 capsule 3   vitamin B-12 (CYANOCOBALAMIN) 500 MCG tablet Take 500 mcg by mouth daily.     esomeprazole (NEXIUM) 40 MG capsule Take 40 mg by mouth daily at 12 noon. Taking 20 mg (Sean Mcgee not taking: Reported on 06/29/2021)     No current facility-administered medications for this visit.    Allergies:   Sean Mcgee has no known allergies.   Social History:  The Sean Mcgee  reports that Sean Mcgee  has never smoked. Sean Mcgee has never used smokeless tobacco. Sean Mcgee reports current alcohol use of about 6.0 standard drinks per week. Sean Mcgee reports that Sean Mcgee does not use drugs.   Family History:   family history includes Cancer in an other family member; Coronary artery disease in an other family member; Stomach cancer in his father.    Review of Systems: Review of Systems  Constitutional: Negative.   HENT:  Negative.    Respiratory: Negative.    Cardiovascular: Negative.   Gastrointestinal: Negative.   Musculoskeletal: Negative.   Neurological: Negative.   Psychiatric/Behavioral: Negative.    All other systems reviewed and are negative.   PHYSICAL EXAM: VS:  BP (!) 166/74 (BP Location: Right Arm, Sean Mcgee Position: Sitting, Cuff Size: Normal)   Pulse (!) 58   Ht '6\' 2"'$  (1.88 m)   Wt 179 lb 6 oz (81.4 kg)   SpO2 97%   BMI 23.03 kg/m  , BMI Body mass index is 23.03 kg/m. GEN: Well nourished, well developed, in no acute distress HEENT: normal Neck: no JVD, carotid bruits, or masses Cardiac: RRR; no murmurs, rubs, or gallops,no edema  Respiratory:  clear to auscultation bilaterally, normal work of breathing GI: soft, nontender, nondistended, + BS MS: no deformity or atrophy Skin: warm and dry, no rash Neuro:  Strength and sensation are intact Psych: euthymic mood, full affect  Recent Labs: 05/02/2021: ALT 20; BUN 21; Creatinine, Ser 1.20; Hemoglobin 16.7; Platelets 110; Potassium 4.1; Sodium 141; TSH 0.955    Lipid Panel Lab Results  Component Value Date   CHOL 143 12/04/2020   HDL 54.40 12/04/2020   LDLCALC 77 12/04/2020   TRIG 57.0 12/04/2020      Wt Readings from Last 3 Encounters:  06/29/21 179 lb 6 oz (81.4 kg)  06/12/21 180 lb 6 oz (81.8 kg)  05/22/21 177 lb 4 oz (80.4 kg)      ASSESSMENT AND PLAN:  Problem List Items Addressed This Visit       Cardiology Problems   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)   Relevant Orders   EKG 12-Lead   PFO (patent foramen ovale)   PAF (paroxysmal atrial fibrillation) (HCC) - Primary   Relevant Orders   EKG 12-Lead   Hyperlipemia, mixed   Essential hypertension   Paroxysmal atrial fibrillation On Eliquis 5 twice daily given recent stroke Rate and rhythm control with metoprolol succinate 50 daily Given bradycardia, no changes to his dosing  History of stroke Full work-up done as detailed above,  etiology felt most likely secondary to paroxysmal A-fib On statin, Eliquis  Essential hypertension Reports continued high blood pressure on benazepril 40 with metoprolol succinate 50 daily Recommend Sean Mcgee add amlodipine 5 mg daily Monitor for lower extremity edema  Hyperlipidemia Previously on Zocor, recent transition to Lipitor 40 daily Routine lab work with Dr. Sarajane Jews   Total encounter time more than 60 minutes  Greater than 50% was spent in counseling and coordination of care with the Sean Mcgee    Signed, Esmond Plants, M.D., Ph.D. [place], [place]

## 2021-06-29 ENCOUNTER — Encounter: Payer: Self-pay | Admitting: Cardiovascular Disease

## 2021-06-29 ENCOUNTER — Ambulatory Visit: Payer: Medicare HMO | Admitting: Cardiovascular Disease

## 2021-06-29 VITALS — BP 166/74 | HR 58 | Ht 74.0 in | Wt 179.4 lb

## 2021-06-29 DIAGNOSIS — I480 Paroxysmal atrial fibrillation: Secondary | ICD-10-CM

## 2021-06-29 DIAGNOSIS — I10 Essential (primary) hypertension: Secondary | ICD-10-CM

## 2021-06-29 DIAGNOSIS — Q2112 Patent foramen ovale: Secondary | ICD-10-CM

## 2021-06-29 DIAGNOSIS — E782 Mixed hyperlipidemia: Secondary | ICD-10-CM | POA: Diagnosis not present

## 2021-06-29 DIAGNOSIS — I63412 Cerebral infarction due to embolism of left middle cerebral artery: Secondary | ICD-10-CM | POA: Diagnosis not present

## 2021-06-29 MED ORDER — AMLODIPINE BESYLATE 5 MG PO TABS: 5.0000 mg | ORAL_TABLET | Freq: Every day | ORAL | 6 refills | Status: DC

## 2021-06-29 NOTE — Patient Instructions (Addendum)
Medication Instructions:  Please start amlodipine 5 mg daily  If you need a refill on your cardiac medications before your next appointment, please call your pharmacy.   Lab work: No new labs needed  Testing/Procedures: No new testing needed  Follow-Up: At [facility], you and your health needs are our priority.  As part of our continuing mission to provide you with exceptional heart care, we have created designated Provider Care Teams.  These Care Teams include your primary Cardiologist (physician) and Advanced Practice Providers (APPs -  Physician Assistants and Nurse Practitioners) who all work together to provide you with the care you need, when you need it.  You will need a follow up appointment in 6 months  Providers on your designated Care Team:   Murray Hodgkins, NP Christell Faith, PA-C Cadence Kathlen Mody, [place]  COVID-19 Vaccine Information can be found at: ShippingScam.co.uk For questions related to vaccine distribution or appointments, please email [email or web address] or call [phone].

## 2021-08-07 ENCOUNTER — Other Ambulatory Visit: Payer: Self-pay

## 2021-08-10 ENCOUNTER — Other Ambulatory Visit: Payer: Self-pay

## 2021-08-10 NOTE — Patient Outreach (Signed)
[facility]) Care Management  08/10/2021  Sean Mcgee February 14, 1945 [id number]   Telephone outreach to patient to obtain mRS was successfully completed. MRS= [place] Management Assistant [phone]

## 2021-08-15 ENCOUNTER — Other Ambulatory Visit: Payer: Self-pay | Admitting: Family Medicine

## 2021-08-30 DIAGNOSIS — L821 Other seborrheic keratosis: Secondary | ICD-10-CM | POA: Diagnosis not present

## 2021-08-30 DIAGNOSIS — D225 Melanocytic nevi of trunk: Secondary | ICD-10-CM | POA: Diagnosis not present

## 2021-08-30 DIAGNOSIS — D2261 Melanocytic nevi of right upper limb, including shoulder: Secondary | ICD-10-CM | POA: Diagnosis not present

## 2021-08-30 DIAGNOSIS — Z85828 Personal history of other malignant neoplasm of skin: Secondary | ICD-10-CM | POA: Diagnosis not present

## 2021-08-30 DIAGNOSIS — L570 Actinic keratosis: Secondary | ICD-10-CM | POA: Diagnosis not present

## 2021-08-30 DIAGNOSIS — D2272 Melanocytic nevi of left lower limb, including hip: Secondary | ICD-10-CM | POA: Diagnosis not present

## 2021-08-30 DIAGNOSIS — X32XXXA Exposure to sunlight, initial encounter: Secondary | ICD-10-CM | POA: Diagnosis not present

## 2021-09-21 ENCOUNTER — Telehealth: Payer: Self-pay

## 2021-09-21 NOTE — Telephone Encounter (Signed)
Unsuccessful attempt to reach patient on preferred number listed in notes for scheduled AWV. Left message on voicemail okay to reschedule. 

## 2021-09-27 ENCOUNTER — Telehealth: Payer: Self-pay | Admitting: Family Medicine

## 2021-09-27 NOTE — Telephone Encounter (Signed)
Left message for patient to call back and schedule Medicare Annual Wellness Visit (AWV) either virtually or in office. Left  my Sean Mcgee number [phone]   Last AWV ;09/18/20  please schedule at anytime with [facility] Nurse Health Advisor 1 or 2

## 2021-10-08 ENCOUNTER — Ambulatory Visit (INDEPENDENT_AMBULATORY_CARE_PROVIDER_SITE_OTHER): Payer: Medicare HMO

## 2021-10-08 VITALS — Ht 74.0 in | Wt 179.0 lb

## 2021-10-08 DIAGNOSIS — Z0000 Encounter for general adult medical examination without abnormal findings: Secondary | ICD-10-CM | POA: Diagnosis not present

## 2021-10-08 NOTE — Patient Instructions (Addendum)
Mr. Alfrey , Thank you for taking time to come for your Medicare Wellness Visit. I appreciate your ongoing commitment to your health goals. Please review the following plan we discussed and let me know if I can assist you in the future.   Screening recommendations/referrals: Colonoscopy: Done 02/23/20 Recommended yearly ophthalmology/optometry visit for glaucoma screening and checkup Recommended yearly dental visit for hygiene and checkup  Vaccinations: Influenza vaccine: Up to date Pneumococcal vaccine: Up to date  Shingles vaccine: Deferred   Covid-19: Up to date  Advanced directives: Advance directive discussed with you today. Even though you declined this today, please call our office should you change your mind, and we can give you the proper paperwork for you to fill out.   Conditions/risks identified: None  Next appointment: Follow up in one year for your annual wellness visit.   Preventive Care 38 Years and Older, Male  Preventive care refers to lifestyle choices and visits with your health care provider that can promote health and wellness. What does preventive care include? A yearly physical exam. This is also called an annual well check. Dental exams once or twice a year. Routine eye exams. Ask your health care provider how often you should have your eyes checked. Personal lifestyle choices, including: Daily care of your teeth and gums. Regular physical activity. Eating a healthy diet. Avoiding tobacco and drug use. Limiting alcohol use. Practicing safe sex. Taking low doses of aspirin every day. Taking vitamin and mineral supplements as recommended by your health care provider. What happens during an annual well check? The services and screenings done by your health care provider during your annual well check will depend on your age, overall health, lifestyle risk factors, and family history of disease. Counseling  Your health care provider may ask you questions about  your: Alcohol use. Tobacco use. Drug use. Emotional well-being. Home and relationship well-being. Sexual activity. Eating habits. History of falls. Memory and ability to understand (cognition). Work and work Statistician. Screening  You may have the following tests or measurements: Height, weight, and BMI. Blood pressure. Lipid and cholesterol levels. These may be checked every 5 years, or more frequently if you are over 81 years old. Skin check. Lung cancer screening. You may have this screening every year starting at age 41 if you have a 30-pack-year history of smoking and currently smoke or have quit within the past 15 years. Fecal occult blood test (FOBT) of the stool. You may have this test every year starting at age 107. Flexible sigmoidoscopy or colonoscopy. You may have a sigmoidoscopy every 5 years or a colonoscopy every 10 years starting at age 72. Prostate cancer screening. Recommendations will vary depending on your family history and other risks. Hepatitis C blood test. Hepatitis B blood test. Sexually transmitted disease (STD) testing. Diabetes screening. This is done by checking your blood sugar (glucose) after you have not eaten for a while (fasting). You may have this done every 1-3 years. Abdominal aortic aneurysm (AAA) screening. You may need this if you are a current or former smoker. Osteoporosis. You may be screened starting at age 15 if you are at high risk. Talk with your health care provider about your test results, treatment options, and if necessary, the need for more tests. Vaccines  Your health care provider may recommend certain vaccines, such as: Influenza vaccine. This is recommended every year. Tetanus, diphtheria, and acellular pertussis (Tdap, Td) vaccine. You may need a Td booster every 10 years. Zoster vaccine. You may  need this after age 66. Pneumococcal 13-valent conjugate (PCV13) vaccine. One dose is recommended after age 78. Pneumococcal  polysaccharide (PPSV23) vaccine. One dose is recommended after age 73. Talk to your health care provider about which screenings and vaccines you need and how often you need them. This information is not intended to replace advice given to you by your health care provider. Make sure you discuss any questions you have with your health care provider. Document Released: 02/24/2015 Document Revised: 10/18/2015 Document Reviewed: 11/29/2014 Elsevier Interactive Patient Education  2017 [place] Prevention in the Home Falls can cause injuries. They can happen to people of all ages. There are many things you can do to make your home safe and to help prevent falls. What can I do on the outside of my home? Regularly fix the edges of walkways and driveways and fix any cracks. Remove anything that might make you trip as you walk through a door, such as a raised step or threshold. Trim any bushes or trees on the path to your home. Use bright outdoor lighting. Clear any walking paths of anything that might make someone trip, such as rocks or tools. Regularly check to see if handrails are loose or broken. Make sure that both sides of any steps have handrails. Any raised decks and porches should have guardrails on the edges. Have any leaves, snow, or ice cleared regularly. Use sand or salt on walking paths during winter. Clean up any spills in your garage right away. This includes oil or grease spills. What can I do in the bathroom? Use night lights. Install grab bars by the toilet and in the tub and shower. Do not use towel bars as grab bars. Use non-skid mats or decals in the tub or shower. If you need to sit down in the shower, use a plastic, non-slip stool. Keep the floor dry. Clean up any water that spills on the floor as soon as it happens. Remove soap buildup in the tub or shower regularly. Attach bath mats securely with double-sided non-slip rug tape. Do not have throw rugs and other  things on the floor that can make you trip. What can I do in the bedroom? Use night lights. Make sure that you have a light by your bed that is easy to reach. Do not use any sheets or blankets that are too big for your bed. They should not hang down onto the floor. Have a firm chair that has side arms. You can use this for support while you get dressed. Do not have throw rugs and other things on the floor that can make you trip. What can I do in the kitchen? Clean up any spills right away. Avoid walking on wet floors. Keep items that you use a lot in easy-to-reach places. If you need to reach something above you, use a strong step stool that has a grab bar. Keep electrical cords out of the way. Do not use floor polish or wax that makes floors slippery. If you must use wax, use non-skid floor wax. Do not have throw rugs and other things on the floor that can make you trip. What can I do with my stairs? Do not leave any items on the stairs. Make sure that there are handrails on both sides of the stairs and use them. Fix handrails that are broken or loose. Make sure that handrails are as long as the stairways. Check any carpeting to make sure that it is firmly attached  to the stairs. Fix any carpet that is loose or worn. Avoid having throw rugs at the top or bottom of the stairs. If you do have throw rugs, attach them to the floor with carpet tape. Make sure that you have a light switch at the top of the stairs and the bottom of the stairs. If you do not have them, ask someone to add them for you. What else can I do to help prevent falls? Wear shoes that: Do not have high heels. Have rubber bottoms. Are comfortable and fit you well. Are closed at the toe. Do not wear sandals. If you use a stepladder: Make sure that it is fully opened. Do not climb a closed stepladder. Make sure that both sides of the stepladder are locked into place. Ask someone to hold it for you, if possible. Clearly  mark and make sure that you can see: Any grab bars or handrails. First and last steps. Where the edge of each step is. Use tools that help you move around (mobility aids) if they are needed. These include: Canes. Walkers. Scooters. Crutches. Turn on the lights when you go into a dark area. Replace any light bulbs as soon as they burn out. Set up your furniture so you have a clear path. Avoid moving your furniture around. If any of your floors are uneven, fix them. If there are any pets around you, be aware of where they are. Review your medicines with your doctor. Some medicines can make you feel dizzy. This can increase your chance of falling. Ask your doctor what other things that you can do to help prevent falls. This information is not intended to replace advice given to you by your health care provider. Make sure you discuss any questions you have with your health care provider. Document Released: 11/24/2008 Document Revised: 07/06/2015 Document Reviewed: 03/04/2014 Elsevier Interactive Patient Education  2017 [employer].

## 2021-10-08 NOTE — Progress Notes (Signed)
Subjective:   Sean Mcgee is a 76 y.o. male who presents for Medicare Annual/Subsequent preventive examination.  Review of Systems    Virtual Visit via Telephone Note  I connected with  Sean Mcgee on 10/08/21 at  3:00 PM EDT by telephone and verified that I am speaking with the correct person using two identifiers.  Location: Patient: Home Provider: Office Persons participating in the virtual visit: patient/Nurse Health Advisor   I discussed the limitations, risks, security and privacy concerns of performing an evaluation and management service by telephone and the availability of in person appointments. The patient expressed understanding and agreed to proceed.  Interactive audio and video telecommunications were attempted between this nurse and patient, however failed, due to patient having technical difficulties OR patient did not have access to video capability.  We continued and completed visit with audio only.  Some vital signs may be absent or patient reported.   Criselda Peaches, LPN  Cardiac Risk Factors include: advanced age (>38mn, >>4women);hypertension;male gender     Objective:    Today's Vitals   10/08/21 1504  Weight: 179 lb (81.2 kg)  Height: '6\' 2"'$  (1.88 m)   Body mass index is 22.98 kg/m.     10/08/2021    3:18 PM 05/01/2021   11:11 AM 01/03/2021   10:23 AM 09/18/2020    8:22 AM 05/01/2016   10:54 AM  Advanced Directives  Does Patient Have a Medical Advance Directive? No No No No No  Would patient like information on creating a medical advance directive? No - Patient declined No - Patient declined No - Patient declined No - Patient declined     Current Medications (verified) Outpatient Encounter Medications as of 10/08/2021  Medication Sig   amLODipine (NORVASC) 5 MG tablet Take 1 tablet (5 mg total) by mouth daily.   apixaban (ELIQUIS) 5 MG TABS tablet Take 1 tablet (5 mg total) by mouth 2 (two) times daily.   atorvastatin (LIPITOR) 40 MG  tablet Take 1 tablet (40 mg total) by mouth daily.   benazepril (LOTENSIN) 40 MG tablet Take 1 tablet (40 mg total) by mouth daily.   esomeprazole (NEXIUM) 40 MG capsule Take 40 mg by mouth daily at 12 noon. Taking 20 mg (Patient not taking: Reported on 06/29/2021)   levothyroxine (SYNTHROID) 150 MCG tablet TAKE 1 TABLET BY MOUTH ONCE A DAY BEFOREBREAKFAST   metoprolol succinate (TOPROL-XL) 50 MG 24 hr tablet TAKE 1 TABLET BY MOUTH ONCE DAILY TAKE WITH OR IMMEDIATELY FOLLOWING A MEAL   omeprazole (PRILOSEC) 20 MG capsule Take 1 capsule (20 mg total) by mouth every other day.   vitamin B-12 (CYANOCOBALAMIN) 500 MCG tablet Take 500 mcg by mouth daily.   No facility-administered encounter medications on file as of 10/08/2021.    Allergies (verified) Patient has no known allergies.   History: Past Medical History:  Diagnosis Date   ED (erectile dysfunction)    GERD (gastroesophageal reflux disease)    Hyperlipemia    Hypertension    Hypothyroid    Low testosterone    Skin cancer    basel cell removed in office    Past Surgical History:  Procedure Laterality Date   COLONOSCOPY  02/23/2020   per Dr. AHavery Moros adenomatous and sessile serrated polyps, repeat in 5 years   HERNIA REPAIR     inguinal  bilateral   POLYPECTOMY     VASECTOMY     Family History  Problem Relation Age of Onset  Coronary artery disease Other    Cancer Other        abdominal   Stomach cancer Father    Colon cancer Neg Hx    Esophageal cancer Neg Hx    Rectal cancer Neg Hx    Colon polyps Neg Hx    Social History   Socioeconomic History   Marital status: Single    Spouse name: Not on file   Number of children: Not on file   Years of education: Not on file   Highest education level: Not on file  Occupational History   Not on file  Tobacco Use   Smoking status: Never   Smokeless tobacco: Never  Vaping Use   Vaping Use: Never used  Substance and Sexual Activity   Alcohol use: Yes     Alcohol/week: 6.0 standard drinks of alcohol    Types: 6 Glasses of wine per week    Comment: glass of wine   Drug use: No   Sexual activity: Not on file  Other Topics Concern   Not on file  Social History Narrative   Not on file   Social Determinants of Health   Financial Resource Strain: Low Risk  (10/08/2021)   Overall Financial Resource Strain (CARDIA)    Difficulty of Paying Living Expenses: Not hard at all  Food Insecurity: No Food Insecurity (10/08/2021)   Hunger Vital Sign    Worried About Running Out of Food in the Last Year: Never true    Ran Out of Food in the Last Year: Never true  Transportation Needs: No Transportation Needs (10/08/2021)   PRAPARE - Hydrologist (Medical): No    Lack of Transportation (Non-Medical): No  Physical Activity: Inactive (10/08/2021)   Exercise Vital Sign    Days of Exercise per Week: 0 days    Minutes of Exercise per Session: 0 min  Stress: No Stress Concern Present (10/08/2021)   [place]    Feeling of Stress : Not at all  Social Connections: [place] (10/08/2021)   Social Connection and Isolation Panel [NHANES]    Frequency of Communication with Friends and Family: More than three times a week    Frequency of Social Gatherings with Friends and Family: More than three times a week    Attends Religious Services: More than 4 times per year    Active Member of [employer] or Organizations: Yes    Attends Music therapist: More than 4 times per year    Marital Status: Married     Clinical Intake:  Pre-visit preparation completed: No  Pain : No/denies pain     BMI - recorded: 23.02 Nutritional Status: BMI of 19-24  Normal Nutritional Risks: None Diabetes: No  How often do you need to have someone help you when you read instructions, pamphlets, or other written materials from your doctor or pharmacy?: 1 -  Never  Diabetic?  No  Interpreter Needed?: No  Information entered by :: Rolene Arbour LPN   Activities of Daily Living    10/08/2021    3:15 PM 12/04/2020   10:23 AM  In your present state of health, do you have any difficulty performing the following activities:  Hearing? 0 0  Vision? 0 0  Difficulty concentrating or making decisions? 0 0  Walking or climbing stairs? 0 0  Dressing or bathing? 0 0  Doing errands, shopping? 0 0  Preparing Food and eating ?  N   Using the Toilet? N   In the past six months, have you accidently leaked urine? N   Do you have problems with loss of bowel control? N   Managing your Medications? N   Managing your Finances? N   Housekeeping or managing your Housekeeping? N     Patient Care Team: Laurey Morale, MD as PCP - General  Indicate any recent Medical Services you may have received from other than [facility] providers in the past year (date may be approximate).     Assessment:   This is a routine wellness examination for [place].  Hearing/Vision screen Hearing Screening - Comments:: Denies hearing difficulties. Vision Screening - Comments:: Wears reading glasses. Followed by Dr Ellin Mayhew  Dietary issues and exercise activities discussed: Current Exercise Habits: The patient does not participate in regular exercise at present, Exercise limited by: None identified   Goals Addressed               This Visit's Progress     Patient stated (pt-stated)        To say no and do less. Stay healthy       Depression Screen    10/08/2021    3:13 PM 06/12/2021    9:53 AM 05/22/2021    3:11 PM 05/11/2021    2:58 PM 12/04/2020   10:22 AM 09/18/2020    8:23 AM 09/18/2020    8:21 AM  PHQ 2/9 Scores  PHQ - 2 Score 0 0 0 0 0 0 0  PHQ- 9 Score  0 0 1 0      Fall Risk    10/08/2021    3:16 PM 06/12/2021    9:53 AM 05/22/2021    3:11 PM 05/11/2021    2:58 PM 09/18/2020    8:23 AM  [place] in the past year? 0 0 1 1 0  Number falls in past  yr: 0 0 0 0 0  Injury with Fall? 0 0 1 1 0  Risk for fall due to : No Fall Risks No Fall Risks No Fall Risks No Fall Risks No Fall Risks  Follow up Falls prevention discussed Falls evaluation completed Falls evaluation completed Falls evaluation completed Falls evaluation completed    FALL RISK PREVENTION PERTAINING TO THE HOME:  Any stairs in or around the home? Yes  If so, are there any without handrails? No  Home free of loose throw rugs in walkways, pet beds, electrical cords, etc? Yes  Adequate lighting in your home to reduce risk of falls? Yes   ASSISTIVE DEVICES UTILIZED TO PREVENT FALLS:  Life alert? No  Use of a cane, walker or w/c? No  Grab bars in the bathroom? No  Shower chair or bench in shower? Yes Elevated toilet seat or a handicapped toilet? No   TIMED UP AND GO:  Was the test performed? No . Audio Visit  Cognitive Function:        10/08/2021    3:18 PM  6CIT Screen  What Year? 0 points  What month? 0 points  What time? 0 points  Count back from 20 0 points  Months in reverse 0 points  Repeat phrase 0 points  Total Score 0 points    Immunizations Immunization History  Administered Date(s) Administered   Fluad Quad(high Dose 65+) 11/23/2018, 11/24/2019, 12/04/2020   Pneumococcal Conjugate-13 12/04/2020      Flu Vaccine status: Up to date  Pneumococcal vaccine status: Up to date  Covid-19 vaccine status: Completed vaccines  Qualifies for Shingles Vaccine? Yes   Zostavax completed No   Shingrix Completed?: No.    Education has been provided regarding the importance of this vaccine. Patient has been advised to call insurance company to determine out of pocket expense if they have not yet received this vaccine. Advised may also receive vaccine at local pharmacy or Health Dept. Verbalized acceptance and understanding.  Screening Tests Health Maintenance  Topic Date Due   INFLUENZA VACCINE  09/11/2021   Pneumonia Vaccine 71+ Years old (2 -  PPSV23 or PCV20) 12/04/2021   COVID-19 Vaccine (1) 10/24/2021 (Originally 05/29/1950)   Zoster Vaccines- Shingrix (1 of 2) 01/08/2022 (Originally 05/28/1964)   TETANUS/TDAP  12/12/2023 (Originally 05/28/1964)   COLONOSCOPY (Pts 45-72yr Insurance coverage will need to be confirmed)  02/22/2025   Hepatitis C Screening  Completed   HPV VACCINES  Aged Out    Health Maintenance  Health Maintenance Due  Topic Date Due   INFLUENZA VACCINE  09/11/2021   Pneumonia Vaccine 75 Years old (2 - PPSV23 or PCV20) 12/04/2021    Colorectal cancer screening: Type of screening: Colonoscopy. Completed 02/23/20. Repeat every 5 years  Lung Cancer Screening: (Low Dose CT Chest recommended if Age 75-80years, 30 pack-year currently smoking OR have quit w/in 15years.) does not qualify.    Additional Screening:  Hepatitis C Screening: does qualify; Completed 12/18/20  Vision Screening: Recommended annual ophthalmology exams for early detection of glaucoma and other disorders of the eye. Is the patient up to date with their annual eye exam?  Yes  Who is the provider or what is the name of the office in which the patient attends annual eye exams? Dr WEllin MayhewIf pt is not established with a provider, would they like to be referred to a provider to establish care? No .   Dental Screening: Recommended annual dental exams for proper oral hygiene  Community Resource Referral / Chronic Care Management:  CRR required this visit?  No   CCM required this visit?  No      Plan:     I have personally reviewed and noted the following in the patient's chart:   Medical and social history Use of alcohol, tobacco or illicit drugs  Current medications and supplements including opioid prescriptions. Patient is not currently taking opioid prescriptions. Functional ability and status Nutritional status Physical activity Advanced directives List of other physicians Hospitalizations, surgeries, and ER visits in  previous 12 months Vitals Screenings to include cognitive, depression, and falls Referrals and appointments  In addition, I have reviewed and discussed with patient certain preventive protocols, quality metrics, and best practice recommendations. A written personalized care plan for preventive services as well as general preventive health recommendations were provided to patient.     BCriselda Peaches LPN   [id number]  Nurse Notes: None

## 2021-11-01 ENCOUNTER — Other Ambulatory Visit: Payer: Medicare HMO

## 2021-11-01 ENCOUNTER — Other Ambulatory Visit: Payer: Self-pay

## 2021-11-01 ENCOUNTER — Other Ambulatory Visit: Payer: Self-pay | Admitting: *Deleted

## 2021-11-01 ENCOUNTER — Inpatient Hospital Stay: Payer: Medicare HMO | Attending: Oncology

## 2021-11-01 DIAGNOSIS — E538 Deficiency of other specified B group vitamins: Secondary | ICD-10-CM

## 2021-11-01 DIAGNOSIS — D693 Immune thrombocytopenic purpura: Secondary | ICD-10-CM | POA: Insufficient documentation

## 2021-11-01 DIAGNOSIS — D696 Thrombocytopenia, unspecified: Secondary | ICD-10-CM

## 2021-11-01 LAB — VITAMIN B12: Vitamin B-12: 558 pg/mL (ref 180–914)

## 2021-11-01 LAB — CBC WITH DIFFERENTIAL/PLATELET
Abs Immature Granulocytes: 0.01 10*3/uL (ref 0.00–0.07)
Basophils Absolute: 0 10*3/uL (ref 0.0–0.1)
Basophils Relative: 0 %
Eosinophils Absolute: 0.1 10*3/uL (ref 0.0–0.5)
Eosinophils Relative: 3 %
HCT: 48.2 % (ref 39.0–52.0)
Hemoglobin: 16 g/dL (ref 13.0–17.0)
Immature Granulocytes: 0 %
Lymphocytes Relative: 24 %
Lymphs Abs: 1.2 10*3/uL (ref 0.7–4.0)
MCH: 29.8 pg (ref 26.0–34.0)
MCHC: 33.2 g/dL (ref 30.0–36.0)
MCV: 89.8 fL (ref 80.0–100.0)
Monocytes Absolute: 0.4 10*3/uL (ref 0.1–1.0)
Monocytes Relative: 9 %
Neutro Abs: 3.1 10*3/uL (ref 1.7–7.7)
Neutrophils Relative %: 64 %
Platelets: 121 10*3/uL — ABNORMAL LOW (ref 150–400)
RBC: 5.37 MIL/uL (ref 4.22–5.81)
RDW: 13.4 % (ref 11.5–15.5)
WBC: 4.9 10*3/uL (ref 4.0–10.5)
nRBC: 0 % (ref 0.0–0.2)

## 2021-12-03 ENCOUNTER — Other Ambulatory Visit: Payer: Self-pay | Admitting: Family Medicine

## 2021-12-18 ENCOUNTER — Encounter: Payer: Self-pay | Admitting: Family Medicine

## 2021-12-18 ENCOUNTER — Ambulatory Visit (INDEPENDENT_AMBULATORY_CARE_PROVIDER_SITE_OTHER): Payer: Medicare HMO | Admitting: Family Medicine

## 2021-12-18 VITALS — BP 124/60 | HR 51 | Temp 97.4°F | Ht 74.0 in | Wt 186.0 lb

## 2021-12-18 DIAGNOSIS — E039 Hypothyroidism, unspecified: Secondary | ICD-10-CM

## 2021-12-18 DIAGNOSIS — I480 Paroxysmal atrial fibrillation: Secondary | ICD-10-CM | POA: Diagnosis not present

## 2021-12-18 DIAGNOSIS — I10 Essential (primary) hypertension: Secondary | ICD-10-CM | POA: Diagnosis not present

## 2021-12-18 DIAGNOSIS — R739 Hyperglycemia, unspecified: Secondary | ICD-10-CM | POA: Diagnosis not present

## 2021-12-18 DIAGNOSIS — E782 Mixed hyperlipidemia: Secondary | ICD-10-CM | POA: Diagnosis not present

## 2021-12-18 DIAGNOSIS — N401 Enlarged prostate with lower urinary tract symptoms: Secondary | ICD-10-CM

## 2021-12-18 DIAGNOSIS — Z23 Encounter for immunization: Secondary | ICD-10-CM | POA: Diagnosis not present

## 2021-12-18 DIAGNOSIS — D696 Thrombocytopenia, unspecified: Secondary | ICD-10-CM

## 2021-12-18 DIAGNOSIS — N138 Other obstructive and reflux uropathy: Secondary | ICD-10-CM

## 2021-12-18 DIAGNOSIS — K219 Gastro-esophageal reflux disease without esophagitis: Secondary | ICD-10-CM | POA: Diagnosis not present

## 2021-12-18 LAB — HEPATIC FUNCTION PANEL
ALT: 16 U/L (ref 0–53)
AST: 13 U/L (ref 0–37)
Albumin: 4.1 g/dL (ref 3.5–5.2)
Alkaline Phosphatase: 67 U/L (ref 39–117)
Bilirubin, Direct: 0.2 mg/dL (ref 0.0–0.3)
Total Bilirubin: 0.9 mg/dL (ref 0.2–1.2)
Total Protein: 6.5 g/dL (ref 6.0–8.3)

## 2021-12-18 LAB — CBC WITH DIFFERENTIAL/PLATELET
Basophils Absolute: 0 10*3/uL (ref 0.0–0.1)
Basophils Relative: 0.5 % (ref 0.0–3.0)
Eosinophils Absolute: 0.1 10*3/uL (ref 0.0–0.7)
Eosinophils Relative: 2.8 % (ref 0.0–5.0)
HCT: 46.2 % (ref 39.0–52.0)
Hemoglobin: 15.7 g/dL (ref 13.0–17.0)
Lymphocytes Relative: 22.9 % (ref 12.0–46.0)
Lymphs Abs: 1.1 10*3/uL (ref 0.7–4.0)
MCHC: 34 g/dL (ref 30.0–36.0)
MCV: 88.5 fl (ref 78.0–100.0)
Monocytes Absolute: 0.5 10*3/uL (ref 0.1–1.0)
Monocytes Relative: 10.5 % (ref 3.0–12.0)
Neutro Abs: 3.1 10*3/uL (ref 1.4–7.7)
Neutrophils Relative %: 63.3 % (ref 43.0–77.0)
Platelets: 113 10*3/uL — ABNORMAL LOW (ref 150.0–400.0)
RBC: 5.23 Mil/uL (ref 4.22–5.81)
RDW: 14.5 % (ref 11.5–15.5)
WBC: 4.9 10*3/uL (ref 4.0–10.5)

## 2021-12-18 LAB — LIPID PANEL
Cholesterol: 116 mg/dL (ref 0–200)
HDL: 49.1 mg/dL (ref >39.00)
LDL Cholesterol: 50 mg/dL (ref 0–99)
NonHDL: 66.58
Total CHOL/HDL Ratio: 2
Triglycerides: 83 mg/dL (ref 0.0–149.0)
VLDL: 16.6 mg/dL (ref 0.0–40.0)

## 2021-12-18 LAB — BASIC METABOLIC PANEL
BUN: 13 mg/dL (ref 6–23)
CO2: 27 mEq/L (ref 19–32)
Calcium: 9.1 mg/dL (ref 8.4–10.5)
Chloride: 104 mEq/L (ref 96–112)
Creatinine, Ser: 0.98 mg/dL (ref 0.40–1.50)
GFR: 75.41 mL/min (ref >60.00)
Glucose, Bld: 94 mg/dL (ref 70–99)
Potassium: 4.1 mEq/L (ref 3.5–5.1)
Sodium: 139 mEq/L (ref 135–145)

## 2021-12-18 LAB — T3, FREE: T3, Free: 3.6 pg/mL (ref 2.3–4.2)

## 2021-12-18 LAB — TSH: TSH: 0.76 u[IU]/mL (ref 0.35–5.50)

## 2021-12-18 LAB — PSA: PSA: 0.49 ng/mL (ref 0.10–4.00)

## 2021-12-18 LAB — HEMOGLOBIN A1C: Hgb A1c MFr Bld: 5.3 % (ref 4.6–6.5)

## 2021-12-18 LAB — T4, FREE: Free T4: 1.26 ng/dL (ref 0.60–1.60)

## 2021-12-18 MED ORDER — AMLODIPINE BESYLATE 5 MG PO TABS: 5.0000 mg | ORAL_TABLET | Freq: Every day | ORAL | 3 refills | Status: DC

## 2021-12-18 NOTE — Progress Notes (Signed)
Subjective:    Patient ID: Sean Mcgee, male    DOB: December 03, 1945, 76 y.o.   MRN: [id number]  HPI Here to follow up on issues. He feels well and has no complaints. He saw his cardiologist, Dr. Rockey Situ, in May, and he started Tim on Amlodipine for the BP. Since then his BP is stable, usually in the range of 110-120/60-70. His HR is in the 50-60 range. He never feels lightheaded and he has no ankle edema. He occasionally feels a "skip" or two in his chest, but no sustained periods of fibrillation. He was mildly constipated a few months ago, but he now controls this by drinking prune juice. His GERD is controlled. 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    Review of Systems  Constitutional: Negative.   HENT: Negative.    Eyes: Negative.   Respiratory: Negative.    Cardiovascular: Negative.   Gastrointestinal: Negative.   Genitourinary: Negative.   Musculoskeletal: Negative.   Skin: Negative.   Neurological: Negative.   Psychiatric/Behavioral: Negative.         Objective:   Physical Exam Constitutional:      General: He is not in acute distress.    Appearance: Normal appearance. He is well-developed. He is not diaphoretic.  HENT:     Head: Normocephalic and atraumatic.     Right Ear: External ear normal.     Left Ear: External ear normal.     Nose: Nose normal.     Mouth/Throat:     Pharynx: No oropharyngeal exudate.  Eyes:     General: No scleral icterus.       Right eye: No discharge.        Left eye: No discharge.     Conjunctiva/sclera: Conjunctivae normal.     Pupils: Pupils are equal, round, and reactive to light.  Neck:     Thyroid: No thyromegaly.     Vascular: No JVD.     Trachea: No tracheal deviation.  Cardiovascular:     Rate and Rhythm: Normal rate and regular rhythm.     Pulses: Normal pulses.     Heart sounds: Normal heart sounds. No murmur heard.     No friction rub. No gallop.     Comments: Occasional ectopy  Pulmonary:     Effort: Pulmonary effort is normal. No respiratory distress.     Breath sounds: Normal breath sounds. No wheezing or rales.  Chest:     Chest wall: No tenderness.  Abdominal:     General: Bowel sounds are normal. There is no distension.     Palpations: Abdomen is soft. There is no mass.     Tenderness: There is no abdominal tenderness. There is no guarding or rebound.  Genitourinary:    Penis: Normal. No tenderness.      Testes: Normal.     Prostate: Normal.     Rectum: Normal. Guaiac result negative.  Musculoskeletal:        General: No tenderness. Normal range of motion.     Cervical back: Neck supple.  Lymphadenopathy:     Cervical: No cervical adenopathy.  Skin:    General: Skin is warm and dry.     Coloration: Skin is not pale.     Findings: No erythema or rash.  Neurological:     Mental Status: He is alert and oriented to person, place, and time.  Cranial Nerves: No cranial nerve deficit.     Motor: No abnormal muscle tone.     Coordination: Coordination normal.     Deep Tendon Reflexes: Reflexes are normal and symmetric. Reflexes normal.  Psychiatric:        Mood and Affect: Mood normal.        Behavior: Behavior normal.        Thought Content: Thought content normal.        Judgment: Judgment normal.           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 His GERD is controlled. We will get fasting labs to check lipids, thyroid levels, etc. We spent a total of (31   ) minutes reviewing records and discussing these issues.  Alysia Penna, MD

## 2022-01-08 ENCOUNTER — Telehealth: Payer: Self-pay | Admitting: *Deleted

## 2022-01-08 ENCOUNTER — Ambulatory Visit: Payer: Medicare HMO | Attending: Nurse Practitioner | Admitting: Nurse Practitioner

## 2022-01-08 ENCOUNTER — Encounter: Payer: Self-pay | Admitting: Nurse Practitioner

## 2022-01-08 VITALS — BP 182/80 | HR 52 | Ht 74.0 in | Wt 186.6 lb

## 2022-01-08 DIAGNOSIS — E785 Hyperlipidemia, unspecified: Secondary | ICD-10-CM

## 2022-01-08 DIAGNOSIS — Z8673 Personal history of transient ischemic attack (TIA), and cerebral infarction without residual deficits: Secondary | ICD-10-CM

## 2022-01-08 DIAGNOSIS — I10 Essential (primary) hypertension: Secondary | ICD-10-CM

## 2022-01-08 DIAGNOSIS — I480 Paroxysmal atrial fibrillation: Secondary | ICD-10-CM

## 2022-01-08 MED ORDER — AMLODIPINE BESYLATE 5 MG PO TABS: 5.0000 mg | ORAL_TABLET | Freq: Every day | ORAL | 3 refills | Status: DC

## 2022-01-08 NOTE — Patient Instructions (Signed)
Medication Instructions:  No changes at this time.   *If you need a refill on your cardiac medications before your next appointment, please call your pharmacy*   Lab Work: None  If you have labs (blood work) drawn today and your tests are completely normal, you will receive your results only by: [place] (if you have MyChart) OR A paper copy in the mail If you have any lab test that is abnormal or we need to change your treatment, we will call you to review the results.   Testing/Procedures: None   Follow-Up: At [facility], you and your health needs are our priority.  As part of our continuing mission to provide you with exceptional heart care, we have created designated Provider Care Teams.  These Care Teams include your primary Cardiologist (physician) and Advanced Practice Providers (APPs -  Physician Assistants and Nurse Practitioners) who all work together to provide you with the care you need, when you need it.   Your next appointment:   6 month(s)  The format for your next appointment:   In Person  Provider:   Ida Rogue, MD or Murray Hodgkins, NP        Important Information About Sugar

## 2022-01-08 NOTE — Telephone Encounter (Signed)
Patient came back into office. When he got home his blood pressure was 159/76. Second check at home was 161/77. Manual check was 160/80, his bp cuff 174/85. Waited a few minutes then went back to recheck. Manual check was 140/78 and his cuff 162/82. Advised that I would forward these readings to provider for his review and will be in contact with him. He was agreeable with plan, verbalized understanding, and had no further questions at this time.

## 2022-01-08 NOTE — Progress Notes (Signed)
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  Past Medical History    Past Medical History:  Diagnosis Date   ED (erectile dysfunction)    GERD (gastroesophageal reflux disease)    Hyperlipemia    Hypertension    Hypothyroid    Low testosterone    PAF (paroxysmal atrial fibrillation) ([place])    a. 04/2021 Dx @ time of embolic TZG-->[id number] = 5-->Eliquis.   PFO (patent foramen ovale)    a. 04/2021 TEE: EF 65-70%, + PFO. 1+ AI/TR/MR/PI.   Skin cancer    basel cell removed in office    Stroke [facility])    a. 04/2021 s/p lytic rx-->recanalization by angio Mikel Cella); b. 04/2021 MRI small burden of acute-subacute L parietal lobe infarct; c. PAF found during hosp-->eliquis.   Past Surgical History:  Procedure Laterality Date   COLONOSCOPY  02/23/2020   per Dr. Havery Moros, adenomatous and sessile serrated polyps, repeat in 5 years   HERNIA REPAIR     inguinal  bilateral   POLYPECTOMY     VASECTOMY      Allergies  No Known Allergies  History of Present Illness    76 year old male with above past medical history including hypertension, paroxysmal atrial fibrillation, embolic stroke, aphasia, thrombocytopenia, hyperlipidemia, PFO, and hypothyroidism.  In March 2023, he was seen at [facility] with stroke.  Imaging revealed a left M2 occlusion and he was placed on tenecteplase.  He was subsequently transferred to [facility] for interventional radiology evaluation.  Cerebral angiogram showed no large vessel occlusion, with recanalization of the vessel.  Subsequent MRI of the head without  contrast showed small burden of acute to subacute infarct within the left parietal lobe.  Transesophageal echocardiogram during hospitalization showed an EF of 65 to 70% without LA/LAA thrombus.  PFO was present by bubble study.  Mild AI, TR, MR, and PI were noted.  He was found to have paroxysmal atrial fibrillation during hospitalization, and was placed on Eliquis at discharge.  Mr. Pettet establish care with Dr. Rockey Situ in May 2023.  He was hypertensive at that time and placed on amlodipine therapy.  Since then, he has been following his blood pressure almost daily with significant improvements, typically trending in the 120s and below.  He remains active without chest pain or dyspnea.  He occasionally notes a brief spell of skipped beats or palpitations, typically lasting just 1 to 3 minutes though at least 1 occasion, he had prolonged irregular heartbeat with normal heart rate while watching football on Sunday.  He believes these episodes account for going into atrial fibrillation and notes that other than noticing an irregular heartbeat, he is not bothered by it at all.  He has been compliant with beta-blocker and Eliquis therapy with recent lab work earlier this month showing normal H&H and blood chemistries.  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  He denies PND, orthopnea, dizziness, syncope, edema, or early satiety.  Home Medications    Current Outpatient Medications  Medication Sig Dispense Refill   amLODipine (NORVASC) 5 MG tablet Take 1 tablet (5 mg total) by mouth daily. 90 tablet 3   apixaban (ELIQUIS) 5  MG TABS tablet Take 1 tablet (5 mg total) by mouth 2 (two) times daily. 180 tablet 3   atorvastatin (LIPITOR) 40 MG tablet Take 1 tablet (40 mg total) by mouth daily. 90 tablet 3   benazepril (LOTENSIN) 40 MG tablet Take 1 tablet (40 mg total) by mouth daily. 90 tablet 3   levothyroxine (SYNTHROID) 150 MCG tablet TAKE 1 TABLET BY MOUTH ONCE A DAY BEFOREBREAKFAST 90 tablet 0    metoprolol succinate (TOPROL-XL) 50 MG 24 hr tablet TAKE 1 TABLET BY MOUTH ONCE DAILY TAKE WITH OR IMMEDIATELY FOLLOWING A MEAL 90 tablet 3   omeprazole (PRILOSEC) 20 MG capsule Take 1 capsule (20 mg total) by mouth every other day. 30 capsule 3   vitamin B-12 (CYANOCOBALAMIN) 500 MCG tablet Take 500 mcg by mouth daily.     No current facility-administered medications for this visit.     Review of Systems    Notes occasional skipped beat but no prolonged palpitations.  Denies chest pain, dyspnea, PND, orthopnea, dizziness, syncope, edema, or early satiety.  All other systems reviewed and are otherwise negative except as noted above.    Physical Exam    VS:  BP (!) 174/60 (BP Location: Left Arm, Patient Position: Sitting)   Pulse (!) 52   Ht '6\' 2"'$  (1.88 m)   Wt 186 lb 9.6 oz (84.6 kg)   SpO2 98%   BMI 23.96 kg/m  , BMI Body mass index is 23.96 kg/m.     Vitals:   01/08/22 0944 01/08/22 1243  BP: (!) 174/60 (!) 182/80  Pulse:    SpO2:      GEN: Well nourished, well developed, in no acute distress. HEENT: normal. Neck: Supple, no JVD, carotid bruits, or masses. Cardiac: RRR, no murmurs, rubs, or gallops. No clubbing, cyanosis, edema.  Radials/PT 2+ and equal bilaterally.  Respiratory:  Respirations regular and unlabored, clear to auscultation bilaterally. GI: Soft, nontender, nondistended, BS + x 4. MS: no deformity or atrophy. Skin: warm and dry, no rash. Neuro:  Strength and sensation are intact. Psych: Normal affect.  Accessory Clinical Findings    ECG personally reviewed by me today -sinus bradycardia, 52, first-degree AV block- no acute changes.  Lab Results  Component Value Date   WBC 4.9 12/18/2021   HGB 15.7 12/18/2021   HCT 46.2 12/18/2021   MCV 88.5 12/18/2021   PLT 113.0 (L) 12/18/2021   Lab Results  Component Value Date   CREATININE 0.98 12/18/2021   BUN 13 12/18/2021   NA 139 12/18/2021   K 4.1 12/18/2021   CL 104 12/18/2021   CO2 27 12/18/2021    Lab Results  Component Value Date   ALT 16 12/18/2021   AST 13 12/18/2021   ALKPHOS 67 12/18/2021   BILITOT 0.9 12/18/2021   Lab Results  Component Value Date   CHOL 116 12/18/2021   HDL 49.10 12/18/2021   LDLCALC 50 12/18/2021   TRIG 83.0 12/18/2021   CHOLHDL 2 12/18/2021    Lab Results  Component Value Date   HGBA1C 5.3 12/18/2021    Assessment & Plan    1.  Paroxysmal atrial fibrillation: This was noted during hospitalization for stroke in March 2023.  Transesophageal echo at that time showed an EF of 65 to 70% without LA/LAA thrombus.  PFO noted.  He has done well on beta-blocker and Eliquis therapy with stable lab work earlier this month.  He occasionally notes irregular heartbeat without any significant or bothersome palpitations.  No changes  to medications today.  2.  Essential hypertension: Blood pressure quite elevated today in the office with numbers in the 170s and 180s on multiple repeats.  Interestingly, he brings a list with him of his pressures from home, and he is pretty consistently in the 120s and 1 teens.  He is currently on amlodipine 5 mg, Lotensin 40 mg, and Toprol-XL 50 mg daily.  We agreed that he would come back to check his cuff against ours.  If his cuff is accurate, it would appear that he has significant whitecoat hypertension.  No medication changes at this time.  3.  Hyperlipidemia: LDL of 50 on statin therapy.  LFTs normal earlier this month.  4.  History of stroke: Embolic in the setting of paroxysmal atrial fibrillation.  No residual deficits.  Remains on Eliquis.  5.  Thrombocytopenia: Stable earlier this month at 113,000.  6.  Disposition: Coming back to the was later today to compare his cuff to our manual readings.  Otherwise, we will plan to follow-up in 6 months.   Murray Hodgkins, NP 01/08/2022, 9:57 AM

## 2022-01-09 NOTE — Telephone Encounter (Signed)
Left voicemail message for patient to call back.

## 2022-01-09 NOTE — Telephone Encounter (Signed)
Thank you.  It appears his cuff is accurate and potentially runs a little high.  This is ok, as his usual at home readings are in the normal range.  He appears to have white coat hypertension.  No change to meds.  Cont to follow BP at home a few times a week and let us know if he cont to trend >113mHg.

## 2022-01-09 NOTE — Telephone Encounter (Signed)
Pt is returning call. Requesting call back.  

## 2022-01-14 MED ORDER — AMLODIPINE BESYLATE 5 MG PO TABS: 5.0000 mg | ORAL_TABLET | Freq: Every day | ORAL | 3 refills | Status: DC

## 2022-01-14 NOTE — Telephone Encounter (Signed)
Spoke with patient and reviewed recommendations by provider. He did report additional readings today and the past couple of checks. 124/61, 124/62, and 124/65. Encouraged him to continue monitoring and let us know if blood pressures stay greater than 342 systolic consistently. He did inquire about previously seeing Dr. Rockey Situ and was told to come by the office if he should have any problems.  Advised that we do allow walk in appointments so he should give [place] a call. Discussed that if it should be at night or weekend that we do have on call staff to assist.  Advised that then to let us know. He verbalized understanding with no further questions at this time.

## 2022-03-05 DIAGNOSIS — X32XXXA Exposure to sunlight, initial encounter: Secondary | ICD-10-CM | POA: Diagnosis not present

## 2022-03-05 DIAGNOSIS — L821 Other seborrheic keratosis: Secondary | ICD-10-CM | POA: Diagnosis not present

## 2022-03-05 DIAGNOSIS — D2262 Melanocytic nevi of left upper limb, including shoulder: Secondary | ICD-10-CM | POA: Diagnosis not present

## 2022-03-05 DIAGNOSIS — D2261 Melanocytic nevi of right upper limb, including shoulder: Secondary | ICD-10-CM | POA: Diagnosis not present

## 2022-03-05 DIAGNOSIS — Z85828 Personal history of other malignant neoplasm of skin: Secondary | ICD-10-CM | POA: Diagnosis not present

## 2022-03-05 DIAGNOSIS — L853 Xerosis cutis: Secondary | ICD-10-CM | POA: Diagnosis not present

## 2022-03-05 DIAGNOSIS — L570 Actinic keratosis: Secondary | ICD-10-CM | POA: Diagnosis not present

## 2022-03-27 ENCOUNTER — Other Ambulatory Visit: Payer: Self-pay | Admitting: Family Medicine

## 2022-04-01 ENCOUNTER — Other Ambulatory Visit: Payer: Self-pay | Admitting: Family Medicine

## 2022-04-16 DIAGNOSIS — H26493 Other secondary cataract, bilateral: Secondary | ICD-10-CM | POA: Diagnosis not present

## 2022-05-01 ENCOUNTER — Other Ambulatory Visit: Payer: Self-pay | Admitting: *Deleted

## 2022-05-01 DIAGNOSIS — D696 Thrombocytopenia, unspecified: Secondary | ICD-10-CM

## 2022-05-03 ENCOUNTER — Inpatient Hospital Stay: Payer: Medicare HMO | Attending: Oncology

## 2022-05-03 ENCOUNTER — Inpatient Hospital Stay (HOSPITAL_BASED_OUTPATIENT_CLINIC_OR_DEPARTMENT_OTHER): Payer: Medicare HMO | Admitting: Oncology

## 2022-05-03 ENCOUNTER — Other Ambulatory Visit: Payer: Self-pay

## 2022-05-03 ENCOUNTER — Encounter: Payer: Self-pay | Admitting: Oncology

## 2022-05-03 ENCOUNTER — Other Ambulatory Visit: Payer: Self-pay | Admitting: *Deleted

## 2022-05-03 VITALS — BP 159/69 | HR 56 | Temp 96.2°F | Resp 18 | Ht 74.0 in | Wt 189.8 lb

## 2022-05-03 DIAGNOSIS — I480 Paroxysmal atrial fibrillation: Secondary | ICD-10-CM | POA: Diagnosis not present

## 2022-05-03 DIAGNOSIS — E538 Deficiency of other specified B group vitamins: Secondary | ICD-10-CM

## 2022-05-03 DIAGNOSIS — Z79899 Other long term (current) drug therapy: Secondary | ICD-10-CM | POA: Diagnosis not present

## 2022-05-03 DIAGNOSIS — Z800 Family history of malignant neoplasm of digestive organs: Secondary | ICD-10-CM | POA: Diagnosis not present

## 2022-05-03 DIAGNOSIS — K219 Gastro-esophageal reflux disease without esophagitis: Secondary | ICD-10-CM | POA: Diagnosis not present

## 2022-05-03 DIAGNOSIS — Z8673 Personal history of transient ischemic attack (TIA), and cerebral infarction without residual deficits: Secondary | ICD-10-CM | POA: Diagnosis not present

## 2022-05-03 DIAGNOSIS — Z7901 Long term (current) use of anticoagulants: Secondary | ICD-10-CM | POA: Diagnosis not present

## 2022-05-03 DIAGNOSIS — E785 Hyperlipidemia, unspecified: Secondary | ICD-10-CM | POA: Insufficient documentation

## 2022-05-03 DIAGNOSIS — Z85828 Personal history of other malignant neoplasm of skin: Secondary | ICD-10-CM | POA: Insufficient documentation

## 2022-05-03 DIAGNOSIS — D693 Immune thrombocytopenic purpura: Secondary | ICD-10-CM | POA: Diagnosis not present

## 2022-05-03 DIAGNOSIS — I10 Essential (primary) hypertension: Secondary | ICD-10-CM | POA: Insufficient documentation

## 2022-05-03 DIAGNOSIS — E039 Hypothyroidism, unspecified: Secondary | ICD-10-CM | POA: Diagnosis not present

## 2022-05-03 DIAGNOSIS — Z8249 Family history of ischemic heart disease and other diseases of the circulatory system: Secondary | ICD-10-CM | POA: Diagnosis not present

## 2022-05-03 DIAGNOSIS — Z7289 Other problems related to lifestyle: Secondary | ICD-10-CM | POA: Diagnosis not present

## 2022-05-03 DIAGNOSIS — D696 Thrombocytopenia, unspecified: Secondary | ICD-10-CM

## 2022-05-03 LAB — CBC WITH DIFFERENTIAL/PLATELET
Abs Immature Granulocytes: 0.02 10*3/uL (ref 0.00–0.07)
Basophils Absolute: 0 10*3/uL (ref 0.0–0.1)
Basophils Relative: 0 %
Eosinophils Absolute: 0.1 10*3/uL (ref 0.0–0.5)
Eosinophils Relative: 2 %
HCT: 46.9 % (ref 39.0–52.0)
Hemoglobin: 15.7 g/dL (ref 13.0–17.0)
Immature Granulocytes: 0 %
Lymphocytes Relative: 22 %
Lymphs Abs: 1.3 10*3/uL (ref 0.7–4.0)
MCH: 30.1 pg (ref 26.0–34.0)
MCHC: 33.5 g/dL (ref 30.0–36.0)
MCV: 89.8 fL (ref 80.0–100.0)
Monocytes Absolute: 0.5 10*3/uL (ref 0.1–1.0)
Monocytes Relative: 8 %
Neutro Abs: 4 10*3/uL (ref 1.7–7.7)
Neutrophils Relative %: 68 %
Platelets: 131 10*3/uL — ABNORMAL LOW (ref 150–400)
RBC: 5.22 MIL/uL (ref 4.22–5.81)
RDW: 13.9 % (ref 11.5–15.5)
WBC: 5.9 10*3/uL (ref 4.0–10.5)
nRBC: 0 % (ref 0.0–0.2)

## 2022-05-03 LAB — VITAMIN B12: Vitamin B-12: 518 pg/mL (ref 180–914)

## 2022-05-03 NOTE — Progress Notes (Signed)
No concerns. 

## 2022-05-03 NOTE — Progress Notes (Signed)
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  Previously patient was known to have a normal white cell count.  On his recent CBC on 12/04/2020 his white cell count was lower as compared to his baseline at 3.5 with a normal differential and mild neutropenia with an ANC of 2.4.  Platelets were lower at 102 with an H&H of 15.9/47.4.     Results of work-up from November 2022 showed a low B12 level of 165.  HIV and hepatitis C testing was negative.  Subsequent B12 levels were normal at 377.  Patient is currently on oral B12.    Interval history-patient is doing well presently and denies any specific complaints at this time  ECOG PS- 0 Pain scale- 0   Review of systems- Review of Systems  Constitutional:  Negative for chills, fever, malaise/fatigue and weight loss.  HENT:  Negative for congestion, ear discharge and nosebleeds.   Eyes:  Negative for blurred vision.  Respiratory:  Negative for cough, hemoptysis, sputum production, shortness of breath and wheezing.   Cardiovascular:  Negative for chest pain, palpitations, orthopnea and claudication.  Gastrointestinal:  Negative for abdominal pain,  blood in stool, constipation, diarrhea, heartburn, melena, nausea and vomiting.  Genitourinary:  Negative for dysuria, flank pain, frequency, hematuria and urgency.  Musculoskeletal:  Negative for back pain, joint pain and myalgias.  Skin:  Negative for rash.  Neurological:  Negative for dizziness, tingling, focal weakness, seizures, weakness and headaches.  Endo/Heme/Allergies:  Does not bruise/bleed easily.  Psychiatric/Behavioral:  Negative for depression and suicidal ideas. The patient does not have insomnia.       No Known Allergies   Past Medical History:  Diagnosis Date   ED (erectile dysfunction)    GERD (gastroesophageal reflux disease)    Hyperlipemia    Hypertension    Hypothyroid    Low testosterone    PAF (paroxysmal atrial fibrillation) ([place])    a. 04/2021 Dx @ time of embolic [id number] = 5-->Eliquis.   PFO (patent foramen ovale)    a. 04/2021 TEE: EF 65-70%, + PFO. 1+ AI/TR/MR/PI.   Skin cancer    basel cell removed in office    Stroke [facility])    a. 04/2021 s/p lytic rx-->recanalization by angio Mikel Cella); b. 04/2021 MRI small burden of acute-subacute L parietal lobe infarct; c. PAF found during hosp-->eliquis.     Past Surgical History:  Procedure Laterality Date   COLONOSCOPY  02/23/2020   per Dr. Havery Moros, adenomatous and sessile serrated polyps, repeat in 5 years   HERNIA REPAIR     inguinal  bilateral   POLYPECTOMY     VASECTOMY      Social History   Socioeconomic History   Marital status: Married    Spouse name: Not on file  Number of children: Not on file   Years of education: Not on file   Highest education level: Not on file  Occupational History   Not on file  Tobacco Use   Smoking status: Never   Smokeless tobacco: Never  Vaping Use   Vaping Use: Never used  Substance and Sexual Activity   Alcohol use: Yes    Alcohol/week: 6.0 standard drinks of alcohol    Types: 6 Glasses of wine per week    Comment: glass of wine   Drug  use: No   Sexual activity: Not on file  Other Topics Concern   Not on file  Social History Narrative   Not on file   Social Determinants of Health   Financial Resource Strain: Low Risk  (10/08/2021)   Overall Financial Resource Strain (CARDIA)    Difficulty of Paying Living Expenses: Not hard at all  Food Insecurity: No Food Insecurity (10/08/2021)   Hunger Vital Sign    Worried About Running Out of Food in the Last Year: Never true    Ran Out of Food in the Last Year: Never true  Transportation Needs: No Transportation Needs (10/08/2021)   PRAPARE - Hydrologist (Medical): No    Lack of Transportation (Non-Medical): No  Physical Activity: Inactive (10/08/2021)   Exercise Vital Sign    Days of Exercise per Week: 0 days    Minutes of Exercise per Session: 0 min  Stress: No Stress Concern Present (10/08/2021)   [place]    Feeling of Stress : Not at all  Social Connections: [place] (10/08/2021)   Social Connection and Isolation Panel [NHANES]    Frequency of Communication with Friends and Family: More than three times a week    Frequency of Social Gatherings with Friends and Family: More than three times a week    Attends Religious Services: More than 4 times per year    Active Member of [employer] or Organizations: Yes    Attends Music therapist: More than 4 times per year    Marital Status: Married  Human resources officer Violence: Not At Risk (10/08/2021)   Humiliation, Afraid, Rape, and Kick questionnaire    Fear of Current or Ex-Partner: No    Emotionally Abused: No    Physically Abused: No    Sexually Abused: No    Family History  Problem Relation Age of Onset   Coronary artery disease Other    Cancer Other        abdominal   Stomach cancer Father    Colon cancer Neg Hx    Esophageal cancer Neg Hx    Rectal cancer Neg Hx    Colon polyps Neg Hx       Current Outpatient Medications:    amLODipine (NORVASC) 5 MG tablet, Take 1 tablet (5 mg total) by mouth daily., Disp: 90 tablet, Rfl: 3   apixaban (ELIQUIS) 5 MG TABS tablet, Take 1 tablet (5 mg total) by mouth 2 (two) times daily., Disp: 180 tablet, Rfl: 3   atorvastatin (LIPITOR) 40 MG tablet, TAKE 1 TABLET BY MOUTH DAILY, Disp: 90 tablet, Rfl: 3   benazepril (LOTENSIN) 40 MG tablet, Take 1 tablet (40 mg total) by mouth daily., Disp: 90 tablet, Rfl: 3   levothyroxine (SYNTHROID) 150 MCG tablet, TAKE 1 TABLET BY MOUTH ONCE A DAY BEFOREBREAKFAST, Disp: 90 tablet, Rfl: 0   metoprolol succinate (TOPROL-XL) 50 MG 24 hr tablet,  TAKE 1 TABLET BY MOUTH ONCE DAILY TAKE WITH OR IMMEDIATELY FOLLOWING A MEAL, Disp: 90 tablet, Rfl: 3   omeprazole (PRILOSEC) 20 MG capsule, Take 1 capsule (20 mg total) by mouth every other day., Disp: 30 capsule, Rfl: 3   vitamin B-12 (CYANOCOBALAMIN) 500 MCG tablet, Take 500 mcg by mouth daily., Disp: , Rfl:   Physical exam:  Vitals:   05/03/22 1058 05/03/22 1102  BP: (!) 156/76 (!) 159/69  Pulse: (!) 101 (!) 56  Resp: 18   Temp: (!) 96.2 F (35.7 C)   TempSrc: Tympanic   SpO2: 100%   Weight: 189 lb 12.8 oz (86.1 kg)   Height: 6\' 2"  (1.88 m)    Physical Exam Cardiovascular:     Rate and Rhythm: Normal rate and regular rhythm.     Heart sounds: Normal heart sounds.  Pulmonary:     Effort: Pulmonary effort is normal.     Breath sounds: Normal breath sounds.  Skin:    General: Skin is warm and dry.  Neurological:     Mental Status: He is alert and oriented to person, place, and time.         Latest Ref Rng & Units 12/18/2021    9:23 AM  CMP  Glucose 70 - 99 mg/dL 94   BUN 6 - 23 mg/dL 13   Creatinine 0.40 - 1.50 mg/dL 0.98   Sodium 135 - 145 mEq/L 139   Potassium 3.5 - 5.1 mEq/L 4.1   Chloride 96 - 112 mEq/L 104   CO2 19 - 32 mEq/L 27   Calcium 8.4 - 10.5 mg/dL 9.1   Total Protein 6.0 - 8.3 g/dL 6.5   Total Bilirubin 0.2 - 1.2 mg/dL 0.9    Alkaline Phos 39 - 117 U/L 67   AST 0 - 37 U/L 13   ALT 0 - 53 U/L 16       Latest Ref Rng & Units 05/03/2022   10:48 AM  CBC  WBC 4.0 - 10.5 K/uL 5.9   Hemoglobin 13.0 - 17.0 g/dL 15.7   Hematocrit 39.0 - 52.0 % 46.9   Platelets 150 - 400 K/uL 131      Assessment and plan- Patient is a 77 y.o. male here for routine follow-up of following issues:  ITP: This is a chronic issue dating back to at least 2016.  Platelet counts have been stable between 100s to 130s without a clear declining trend.  White count and hemoglobin are normal.  He does not require any bone marrow biopsy and can follow-up with Dr. 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  No workup needed for this unless there is a consistent rise in his hemoglobin in the future  He does not require hematology follow-up at this time   Visit Diagnosis 1. B12 deficiency   2. Chronic ITP (idiopathic thrombocytopenia) (HCC)      Dr. Randa Evens, MD, MPH [facility] [id number] 05/03/2022 4:01 PM

## 2022-05-13 ENCOUNTER — Telehealth: Payer: Self-pay | Admitting: Cardiovascular Disease

## 2022-05-13 NOTE — Telephone Encounter (Signed)
Pt c/o swelling: STAT is pt has developed SOB within 24 hours  If swelling, where is the swelling located?both ankles, mostly in the right   How much weight have you gained and in what time span? Did not gain   Have you gained 3 pounds in a day or 5 pounds in a week? Did not gain   Do you have a log of your daily weights (if so, list)? Did not take   Are you currently taking a fluid pill? No   Are you currently SOB? No   Have you traveled recently? No   Pt states he has noticed some swelling for about 3-4 weeks. He states he is unsure if his amlodipine needs to be reduced. Please advise.

## 2022-05-13 NOTE — Telephone Encounter (Signed)
Call placed to the patient. He stated that for the last 3-4 weeks he has been having minor swelling in his ankles, bilateral. This does go down overnight. He does not weigh daily and denies shortness of breath. He does not add salt to his food.   He stated that he was advised to call if he developed lower extremity swelling. He wonders if it is from the Amlodipine.   Blood pressures run 120/60's. He checks this weekly.   He has been advised that Dr. Rockey Situ is currently out of the office.

## 2022-05-22 MED ORDER — AMLODIPINE BESYLATE 5 MG PO TABS: 2.5000 mg | ORAL_TABLET | Freq: Every day | ORAL | Status: DC

## 2022-05-22 NOTE — Telephone Encounter (Signed)
Spoke with patient's wife Fannie Knee (OK per [facility]).  Shared Dr. Windell Hummingbird recommendation with Fannie Knee for Mr. Troiani. Fannie Knee states patient had already started taking 2.5mg  of amlodipine the day after he called in to office.  Fannie Knee reports swelling in ankles is now gone and BP readings remain 120's/60's.  Ernst Spell to have Mr. Sassone continue on amlodipine 2.5mg  QD (medication list updated) and continue to monitor BP. If consistently getting readings greater than 130/80 mmHg call the office.  Fannie Knee verbalized understanding and expressed appreciation for call.

## 2022-05-30 ENCOUNTER — Other Ambulatory Visit: Payer: Self-pay | Admitting: Family Medicine

## 2022-06-12 ENCOUNTER — Other Ambulatory Visit: Payer: Self-pay | Admitting: Family Medicine

## 2022-07-01 ENCOUNTER — Other Ambulatory Visit: Payer: Self-pay | Admitting: Family Medicine

## 2022-07-14 NOTE — Progress Notes (Unsigned)
Cardiology Office Note  Date:  07/15/2022   ID:  Sean, Mcgee [birth year], MRN [id number]  PCP:  Sean Salisbury, MD   Chief Complaint  Patient presents with   6 month follow up     "Doing well." Medications reviewed by the patient verbally.     HPI:  [name] is a 77 year old gentleman with past medical history of Hypertension Thrombocytopenia Paroxysmal atrial fibrillation noted while in the hospital Hyperlipidemia PFO Stroke March 2023, aphasia, secondary to embolism of left middle cerebral artery Who presents for follow-up of his paroxysmal atrial fibrillation  Last seen by myself in clinic May 2023  In the office blood pressure typically runs high, 160 systolic today on initial check with improvement down to 140 systolic  At home, blood pressure systolic Avg 113 to 128/60s diastolic He is happy with his numbers, denies significant side effects Heart rate typically running low in the low 50s, feels he is asymptomatic  Active at baseline No TIA or stroke symptoms  Denies significant chest pain or shortness of breath on exertion No bleeding, remains on blood thinner Does appreciate occasional skipped beats  EKG personally reviewed by myself on todays visit Sinus bradycardia rate 53 bpm no significant ST-T wave changes  Other past medical history reviewed Hospital records reviewed from March 2023 s/p Tenectaplase at [facility]  thrombectomy for left M2 occlusion and was transferred to [facility] for IR intervention.  status post cerebral angiogram done by Dr. Ardyth Man which showed no large vessel occlusion identified.Taken to angio suite with NIH 3 for isolated aphasia and Angio showed recanalization of the vessel. MRI of head without IV contrast done on 05/03/2021 showed small burden of acute to subacute infarction within the left parietal lobe.  Transthoracic Echocardiogram done on 05/03/2021 and TEE done on 05/04/2021 : Left ventricular ejection  fraction was 65-70%.There is no thrombus in the cavity or appendage. Injection of agitated saline documents an interatrial shunt under basal conditions.PFO with positive bubble study. 1+ AI/TR/MR/PI. Irregular heart rhythm, paroxysmal afib [facility] at [facility]   PMH:   has a past medical history of ED (erectile dysfunction), GERD (gastroesophageal reflux disease), Hyperlipemia, Hypertension, Hypothyroid, Low testosterone, PAF (paroxysmal atrial fibrillation) (HCC), PFO (patent foramen ovale), Skin cancer, and Stroke (HCC).  PSH:    Past Surgical History:  Procedure Laterality Date   COLONOSCOPY  02/23/2020   per Dr. Adela Lank, adenomatous and sessile serrated polyps, repeat in 5 years   HERNIA REPAIR     inguinal  bilateral   POLYPECTOMY     VASECTOMY      Current Outpatient Medications  Medication Sig Dispense Refill   amLODipine (NORVASC) 5 MG tablet Take 0.5 tablets (2.5 mg total) by mouth daily.     atorvastatin (LIPITOR) 40 MG tablet TAKE 1 TABLET BY MOUTH DAILY 90 tablet 3   benazepril (LOTENSIN) 40 MG tablet TAKE ONE TABLET BY MOUTH ONCE A DAY 90 tablet 3   ELIQUIS 5 MG TABS tablet TAKE ONE TABLET BY MOUTH TWO TIMES DAILY 180 tablet 3   levothyroxine (SYNTHROID) 150 MCG tablet TAKE ONE TABLET BY MOUTH ONCE A DAY BEFORE BREAKFAST 90 tablet 0   metoprolol succinate (TOPROL-XL) 50 MG 24 hr tablet TAKE 1 TABLET BY MOUTH ONCE DAILY TAKE WITH OR IMMEDIATELY FOLLOWING A MEAL 90 tablet 3   omeprazole (PRILOSEC) 20 MG capsule Take 1 capsule (20 mg total) by mouth every other day. 30 capsule 3   vitamin B-12 (CYANOCOBALAMIN) 500  MCG tablet Take 500 mcg by mouth daily.     No current facility-administered medications for this visit.    Allergies:   Patient has no known allergies.   Social History:  The patient  reports that he has never smoked. He has never used smokeless tobacco. He reports current alcohol use of about 6.0 standard drinks of alcohol per week. He reports that he  does not use drugs.   Family History:   family history includes Cancer in an other family member; Coronary artery disease in an other family member; Stomach cancer in his father.    Review of Systems: Review of Systems  Constitutional: Negative.   HENT: Negative.    Respiratory: Negative.    Cardiovascular: Negative.   Gastrointestinal: Negative.   Musculoskeletal: Negative.   Neurological: Negative.   Psychiatric/Behavioral: Negative.    All other systems reviewed and are negative.   PHYSICAL EXAM: VS:  BP (!) 160/62 (BP Location: Left Arm, Patient Position: Sitting, Cuff Size: Normal)   Pulse (!) 53   Ht 6\' 2"  (1.88 m)   Wt 188 lb 4 oz (85.4 kg)   SpO2 98%   BMI 24.17 kg/m  , BMI Body mass index is 24.17 kg/m. Constitutional:  oriented to person, place, and time. No distress.  HENT:  Head: Grossly normal Eyes:  no discharge. No scleral icterus.  Neck: No JVD, no carotid bruits  Cardiovascular: Regular rate and rhythm, no murmurs appreciated Pulmonary/Chest: Clear to auscultation bilaterally, no wheezes or rails Abdominal: Soft.  no distension.  no tenderness.  Musculoskeletal: Normal range of motion Neurological:  normal muscle tone. Coordination normal. 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)   PFO (patent foramen ovale)   PAF (paroxysmal atrial  fibrillation) (HCC) - Primary   Relevant Orders   EKG 12-Lead   Essential hypertension   Relevant Orders   EKG 12-Lead   Other Visit Diagnoses     Hyperlipidemia LDL goal <70          Paroxysmal atrial fibrillation On Eliquis 5 twice daily given prior stroke Maintaining normal sinus rhythm, on metoprolol succinate 50 daily Recommend he could try metoprolol succinate 25 daily and if blood pressure and rhythm stays well-controlled, new prescription could be sent in  History of stroke In the setting of paroxysmal A-fib On statin, Eliquis  Essential hypertension Blood pressure well-controlled at home  on benazepril 40 with metoprolol succinate 50 daily amlodipine 5 mg daily Typically elevated in the office, mild improvement on recheck  Hyperlipidemia Previously on Zocor, tolerating Lipitor 40 daily Numbers well-controlled   Total encounter time more than 30 minutes  Greater than 50% was spent in counseling and coordination of care with the patient    Signed, Dossie Arbour, M.D., Ph.D. [facility] [place], [place] [phone]

## 2022-07-15 ENCOUNTER — Encounter: Payer: Self-pay | Admitting: Cardiovascular Disease

## 2022-07-15 ENCOUNTER — Ambulatory Visit: Payer: Medicare HMO | Attending: Cardiovascular Disease | Admitting: Cardiovascular Disease

## 2022-07-15 VITALS — BP 140/60 | HR 53 | Ht 74.0 in | Wt 188.2 lb

## 2022-07-15 DIAGNOSIS — I63412 Cerebral infarction due to embolism of left middle cerebral artery: Secondary | ICD-10-CM | POA: Diagnosis not present

## 2022-07-15 DIAGNOSIS — I10 Essential (primary) hypertension: Secondary | ICD-10-CM | POA: Diagnosis not present

## 2022-07-15 DIAGNOSIS — Q2112 Patent foramen ovale: Secondary | ICD-10-CM

## 2022-07-15 DIAGNOSIS — I480 Paroxysmal atrial fibrillation: Secondary | ICD-10-CM

## 2022-07-15 DIAGNOSIS — E785 Hyperlipidemia, unspecified: Secondary | ICD-10-CM | POA: Diagnosis not present

## 2022-07-15 MED ORDER — AMLODIPINE BESYLATE 5 MG PO TABS: 5.0000 mg | ORAL_TABLET | Freq: Every day | ORAL | 3 refills | Status: DC

## 2022-07-15 NOTE — Patient Instructions (Signed)
Medication Instructions:  No changes  If you need a refill on your cardiac medications before your next appointment, please call your pharmacy.   Lab work: No new labs needed  Testing/Procedures: No new testing needed  Follow-Up: At [facility], you and your health needs are our priority.  As part of our continuing mission to provide you with exceptional heart care, we have created designated Provider Care Teams.  These Care Teams include your primary Cardiologist (physician) and Advanced Practice Providers (APPs -  Physician Assistants and Nurse Practitioners) who all work together to provide you with the care you need, when you need it.  You will need a follow up appointment in 12 months  Providers on your designated Care Team:   Christopher Berge, NP Ryan Dunn, PA-C Cadence Furth, PA-C  COVID-19 Vaccine Information can be found at: [email or web address] For questions related to vaccine distribution or appointments, please email [email or web address] or call [phone].   

## 2022-09-02 DIAGNOSIS — H903 Sensorineural hearing loss, bilateral: Secondary | ICD-10-CM | POA: Diagnosis not present

## 2022-09-03 DIAGNOSIS — D225 Melanocytic nevi of trunk: Secondary | ICD-10-CM | POA: Diagnosis not present

## 2022-09-03 DIAGNOSIS — Z08 Encounter for follow-up examination after completed treatment for malignant neoplasm: Secondary | ICD-10-CM | POA: Diagnosis not present

## 2022-09-03 DIAGNOSIS — Z85828 Personal history of other malignant neoplasm of skin: Secondary | ICD-10-CM | POA: Diagnosis not present

## 2022-09-03 DIAGNOSIS — L821 Other seborrheic keratosis: Secondary | ICD-10-CM | POA: Diagnosis not present

## 2022-09-03 DIAGNOSIS — D2261 Melanocytic nevi of right upper limb, including shoulder: Secondary | ICD-10-CM | POA: Diagnosis not present

## 2022-09-03 DIAGNOSIS — D2262 Melanocytic nevi of left upper limb, including shoulder: Secondary | ICD-10-CM | POA: Diagnosis not present

## 2022-09-03 DIAGNOSIS — L538 Other specified erythematous conditions: Secondary | ICD-10-CM | POA: Diagnosis not present

## 2022-09-03 DIAGNOSIS — L918 Other hypertrophic disorders of the skin: Secondary | ICD-10-CM | POA: Diagnosis not present

## 2022-09-03 DIAGNOSIS — L738 Other specified follicular disorders: Secondary | ICD-10-CM | POA: Diagnosis not present

## 2022-09-11 ENCOUNTER — Encounter (INDEPENDENT_AMBULATORY_CARE_PROVIDER_SITE_OTHER): Payer: Self-pay

## 2022-09-25 ENCOUNTER — Other Ambulatory Visit: Payer: Self-pay | Admitting: Family Medicine

## 2022-09-26 ENCOUNTER — Other Ambulatory Visit: Payer: Self-pay | Admitting: Family Medicine

## 2022-12-11 ENCOUNTER — Other Ambulatory Visit: Payer: Self-pay | Admitting: Family Medicine

## 2022-12-20 ENCOUNTER — Ambulatory Visit: Payer: Medicare HMO | Admitting: Family Medicine

## 2022-12-20 ENCOUNTER — Encounter: Payer: Self-pay | Admitting: Family Medicine

## 2022-12-20 VITALS — BP 124/68 | HR 49 | Temp 97.7°F | Ht 74.0 in | Wt 185.0 lb

## 2022-12-20 DIAGNOSIS — K219 Gastro-esophageal reflux disease without esophagitis: Secondary | ICD-10-CM | POA: Diagnosis not present

## 2022-12-20 DIAGNOSIS — R739 Hyperglycemia, unspecified: Secondary | ICD-10-CM

## 2022-12-20 DIAGNOSIS — Z23 Encounter for immunization: Secondary | ICD-10-CM | POA: Diagnosis not present

## 2022-12-20 DIAGNOSIS — I63412 Cerebral infarction due to embolism of left middle cerebral artery: Secondary | ICD-10-CM | POA: Diagnosis not present

## 2022-12-20 DIAGNOSIS — N138 Other obstructive and reflux uropathy: Secondary | ICD-10-CM | POA: Diagnosis not present

## 2022-12-20 DIAGNOSIS — N401 Enlarged prostate with lower urinary tract symptoms: Secondary | ICD-10-CM

## 2022-12-20 DIAGNOSIS — E538 Deficiency of other specified B group vitamins: Secondary | ICD-10-CM

## 2022-12-20 DIAGNOSIS — E039 Hypothyroidism, unspecified: Secondary | ICD-10-CM

## 2022-12-20 DIAGNOSIS — N529 Male erectile dysfunction, unspecified: Secondary | ICD-10-CM | POA: Diagnosis not present

## 2022-12-20 DIAGNOSIS — D696 Thrombocytopenia, unspecified: Secondary | ICD-10-CM

## 2022-12-20 DIAGNOSIS — I10 Essential (primary) hypertension: Secondary | ICD-10-CM

## 2022-12-20 DIAGNOSIS — I480 Paroxysmal atrial fibrillation: Secondary | ICD-10-CM | POA: Diagnosis not present

## 2022-12-20 DIAGNOSIS — E782 Mixed hyperlipidemia: Secondary | ICD-10-CM

## 2022-12-20 LAB — LIPID PANEL
Cholesterol: 117 mg/dL (ref 0–200)
HDL: 48.3 mg/dL (ref >39.00)
LDL Cholesterol: 52 mg/dL (ref 0–99)
NonHDL: 68.22
Total CHOL/HDL Ratio: 2
Triglycerides: 81 mg/dL (ref 0.0–149.0)
VLDL: 16.2 mg/dL (ref 0.0–40.0)

## 2022-12-20 LAB — CBC WITH DIFFERENTIAL/PLATELET
Basophils Absolute: 0 10*3/uL (ref 0.0–0.1)
Basophils Relative: 0.6 % (ref 0.0–3.0)
Eosinophils Absolute: 0.2 10*3/uL (ref 0.0–0.7)
Eosinophils Relative: 3.2 % (ref 0.0–5.0)
HCT: 47.3 % (ref 39.0–52.0)
Hemoglobin: 16 g/dL (ref 13.0–17.0)
Lymphocytes Relative: 24.2 % (ref 12.0–46.0)
Lymphs Abs: 1.4 10*3/uL (ref 0.7–4.0)
MCHC: 33.9 g/dL (ref 30.0–36.0)
MCV: 90.8 fL (ref 78.0–100.0)
Monocytes Absolute: 0.5 10*3/uL (ref 0.1–1.0)
Monocytes Relative: 8.4 % (ref 3.0–12.0)
Neutro Abs: 3.7 10*3/uL (ref 1.4–7.7)
Neutrophils Relative %: 63.6 % (ref 43.0–77.0)
Platelets: 129 10*3/uL — ABNORMAL LOW (ref 150.0–400.0)
RBC: 5.21 Mil/uL (ref 4.22–5.81)
RDW: 14.8 % (ref 11.5–15.5)
WBC: 5.9 10*3/uL (ref 4.0–10.5)

## 2022-12-20 LAB — BASIC METABOLIC PANEL
BUN: 13 mg/dL (ref 6–23)
CO2: 30 meq/L (ref 19–32)
Calcium: 9.1 mg/dL (ref 8.4–10.5)
Chloride: 104 meq/L (ref 96–112)
Creatinine, Ser: 1.11 mg/dL (ref 0.40–1.50)
GFR: 64.48 mL/min (ref >60.00)
Glucose, Bld: 93 mg/dL (ref 70–99)
Potassium: 4.4 meq/L (ref 3.5–5.1)
Sodium: 139 meq/L (ref 135–145)

## 2022-12-20 LAB — HEPATIC FUNCTION PANEL
ALT: 17 U/L (ref 0–53)
AST: 13 U/L (ref 0–37)
Albumin: 4.2 g/dL (ref 3.5–5.2)
Alkaline Phosphatase: 67 U/L (ref 39–117)
Bilirubin, Direct: 0.2 mg/dL (ref 0.0–0.3)
Total Bilirubin: 0.9 mg/dL (ref 0.2–1.2)
Total Protein: 6.8 g/dL (ref 6.0–8.3)

## 2022-12-20 LAB — PSA: PSA: 0.66 ng/mL (ref 0.10–4.00)

## 2022-12-20 LAB — T4, FREE: Free T4: 0.99 ng/dL (ref 0.60–1.60)

## 2022-12-20 LAB — HEMOGLOBIN A1C: Hgb A1c MFr Bld: 5.1 % (ref 4.6–6.5)

## 2022-12-20 LAB — VITAMIN B12: Vitamin B-12: 540 pg/mL (ref 211–911)

## 2022-12-20 LAB — TSH: TSH: 2.24 u[IU]/mL (ref 0.35–5.50)

## 2022-12-20 LAB — T3, FREE: T3, Free: 3.1 pg/mL (ref 2.3–4.2)

## 2022-12-20 MED ORDER — LEVOTHYROXINE SODIUM 150 MCG PO TABS: ORAL_TABLET | ORAL | 3 refills | Status: DC

## 2022-12-20 NOTE — Progress Notes (Signed)
Subjective:    Patient ID: Sean Mcgee, male    DOB: 03/22/1945, 77 y.o.   MRN: [id number]  HPI Here to follow up on issues. He has no current concerns. His PAF is stable, and his HTN is stable. His OA is controlled for the most part. The GERD is stable.    Review of Systems  Constitutional: Negative.   HENT: Negative.    Eyes: Negative.   Respiratory: Negative.    Cardiovascular: Negative.   Gastrointestinal: Negative.   Genitourinary: Negative.   Musculoskeletal: Negative.   Skin: Negative.   Neurological: Negative.   Psychiatric/Behavioral: Negative.         Objective:   Physical Exam Constitutional:      General: He is not in acute distress.    Appearance: Normal appearance. He is well-developed. He is not diaphoretic.  HENT:     Head: Normocephalic and atraumatic.     Right Ear: External ear normal.     Left Ear: External ear normal.     Nose: Nose normal.     Mouth/Throat:     Pharynx: No oropharyngeal exudate.  Eyes:     General: No scleral icterus.       Right eye: No discharge.        Left eye: No discharge.     Conjunctiva/sclera: Conjunctivae normal.     Pupils: Pupils are equal, round, and reactive to light.  Neck:     Thyroid: No thyromegaly.     Vascular: No JVD.     Trachea: No tracheal deviation.  Cardiovascular:     Rate and Rhythm: Normal rate and regular rhythm.     Pulses: Normal pulses.     Heart sounds: Normal heart sounds. No murmur heard.    No friction rub. No gallop.  Pulmonary:     Effort: Pulmonary effort is normal. No respiratory distress.     Breath sounds: Normal breath sounds. No wheezing or rales.  Chest:     Chest wall: No tenderness.  Abdominal:     General: Bowel sounds are normal. There is no distension.     Palpations: Abdomen is soft. There is no mass.     Tenderness: There is no abdominal tenderness. There is no guarding or rebound.  Genitourinary:    Penis: Normal. No tenderness.      Testes: Normal.   Musculoskeletal:        General: No tenderness. Normal range of motion.     Cervical back: Neck supple.  Lymphadenopathy:     Cervical: No cervical adenopathy.  Skin:    General: Skin is warm and dry.     Coloration: Skin is not pale.     Findings: No erythema or rash.  Neurological:     General: No focal deficit present.     Mental Status: He is alert and oriented to person, place, and time.     Cranial Nerves: No cranial nerve deficit.     Motor: No abnormal muscle tone.     Coordination: Coordination normal.     Deep Tendon Reflexes: Reflexes are normal and symmetric. Reflexes normal.  Psychiatric:        Mood and Affect: Mood normal.        Behavior: Behavior normal.        Thought Content: Thought content normal.        Judgment: Judgment normal.           Assessment & Plan:  His PAF and  HTN are well controlled. His OA and GERD are stable. Get fasting labs for lipids, thyroid levels, etc. We spent a total of ( 34  ) minutes reviewing records and discussing these issues.   Gershon Crane, MD

## 2022-12-20 NOTE — Addendum Note (Signed)
Addended by: Carola Rhine on: 12/20/2022 12:01 PM   Modules accepted: Orders

## 2023-03-24 ENCOUNTER — Telehealth: Payer: Self-pay

## 2023-03-24 NOTE — Telephone Encounter (Signed)
 Copied from CRM [id number]. Topic: General - Call Back - No Documentation >> Mar 24, 2023 10:16 AM Sean Mcgee wrote: Reason for CRM: Patient called and said he missed a call from Dr. Leonce Ralph nurse on Friday. No note in chart. Please call patient back if needed.

## 2023-04-08 NOTE — Telephone Encounter (Signed)
 done

## 2023-04-17 DIAGNOSIS — H43393 Other vitreous opacities, bilateral: Secondary | ICD-10-CM | POA: Diagnosis not present

## 2023-04-17 DIAGNOSIS — H26493 Other secondary cataract, bilateral: Secondary | ICD-10-CM | POA: Diagnosis not present

## 2023-04-25 ENCOUNTER — Other Ambulatory Visit: Payer: Self-pay | Admitting: Cardiovascular Disease

## 2023-05-08 ENCOUNTER — Other Ambulatory Visit: Payer: Self-pay | Admitting: Family Medicine

## 2023-05-12 ENCOUNTER — Other Ambulatory Visit: Payer: Self-pay | Admitting: Family Medicine

## 2023-05-28 ENCOUNTER — Other Ambulatory Visit: Payer: Self-pay | Admitting: Family Medicine

## 2023-07-20 IMAGING — CT CT HEAD CODE STROKE
4 series · 16 of 47 positions shown, 18 images · non-contrast
Comparison: None.

CLINICAL DATA: Code stroke.  Right-sided weakness



[Series 2: head 5.0 st · axial · 0.43mm/px · z∈[-82,+38]mm · 7 of 34 slices shown, 9 images]
[im 5/34  brain]
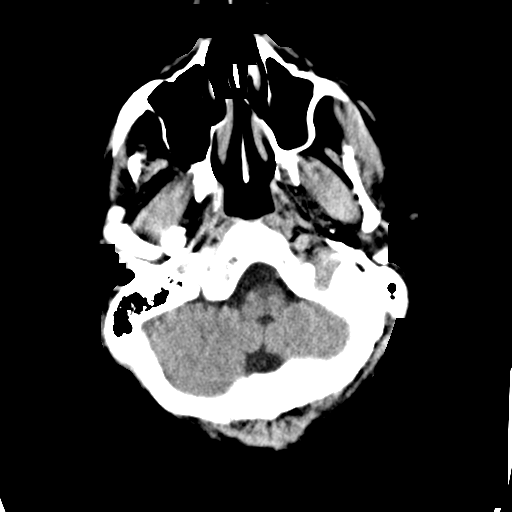
[im 5/34  bone]
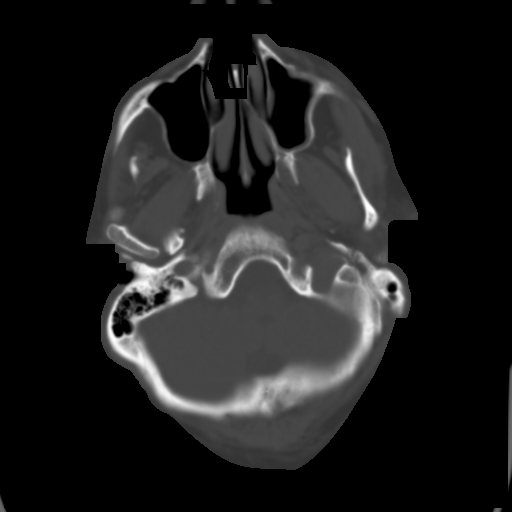
[im 9/34  brain]
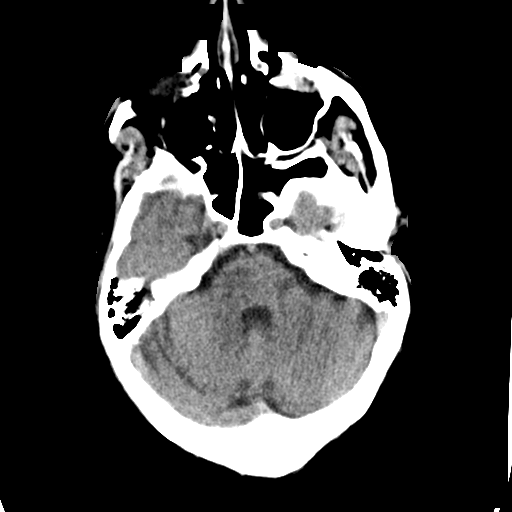
[im 13/34  brain]
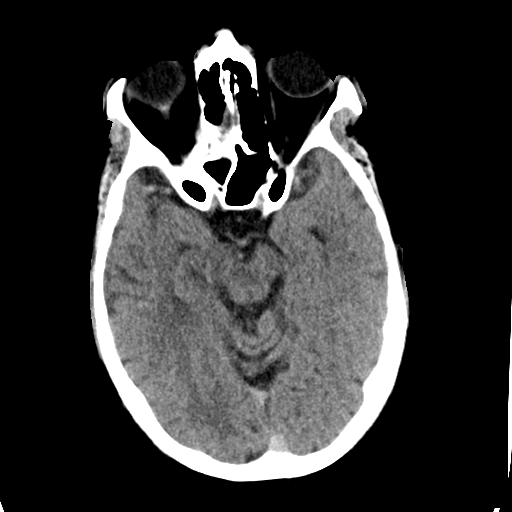
[im 17/34  brain]
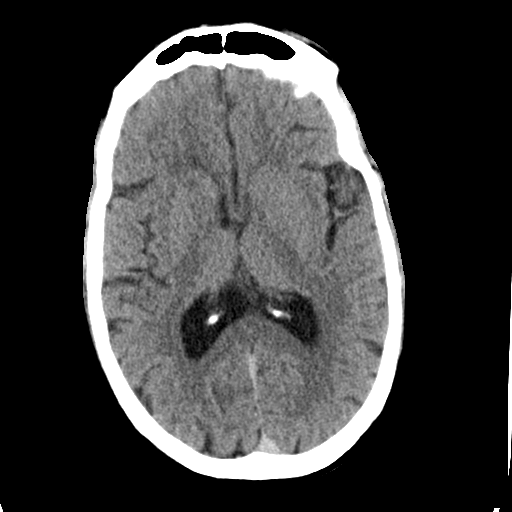
[im 21/34  brain]
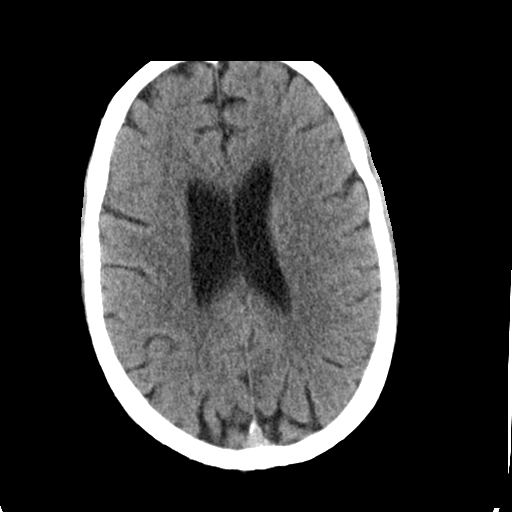
[im 21/34  bone]
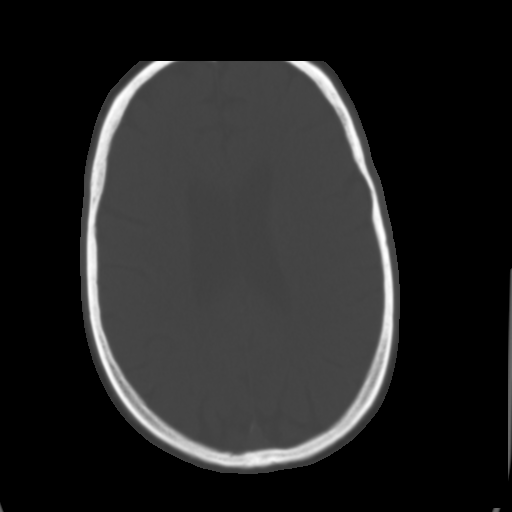
[im 25/34  brain]
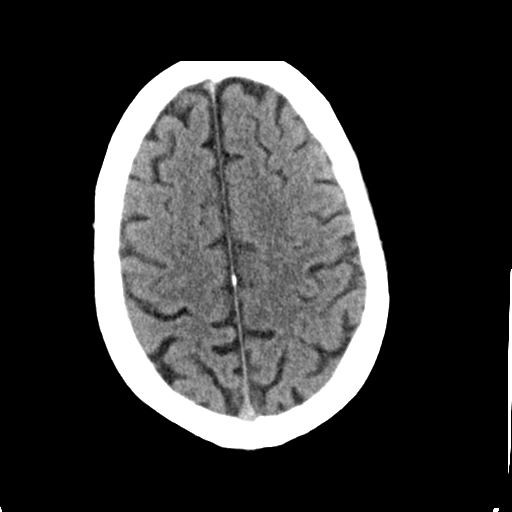
[im 29/34  brain]
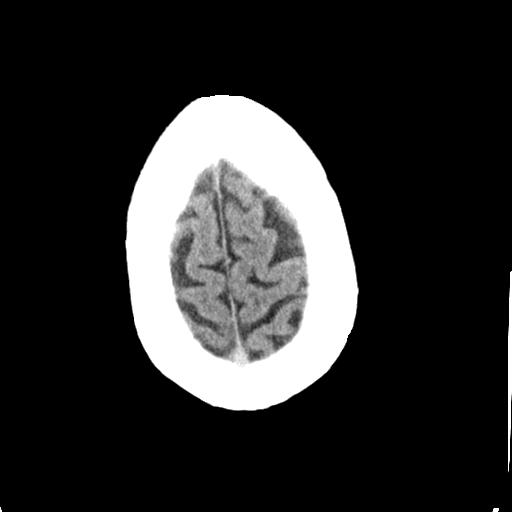

[Series 4: head 2.0 bone · axial · 0.43mm/px · z∈[-86,-52]mm · 3 of 85 slices shown]
[im 9/85  bone]
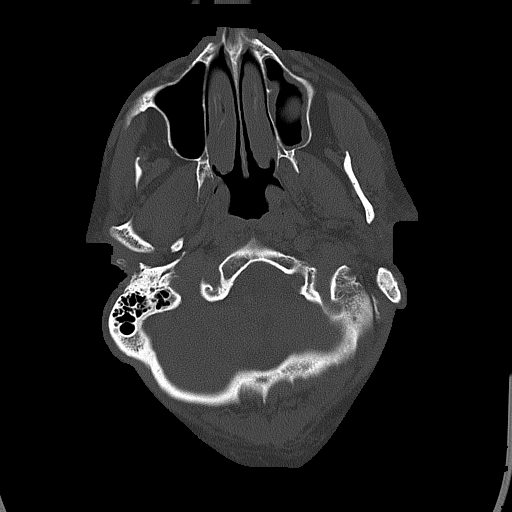
[im 17/85  bone]
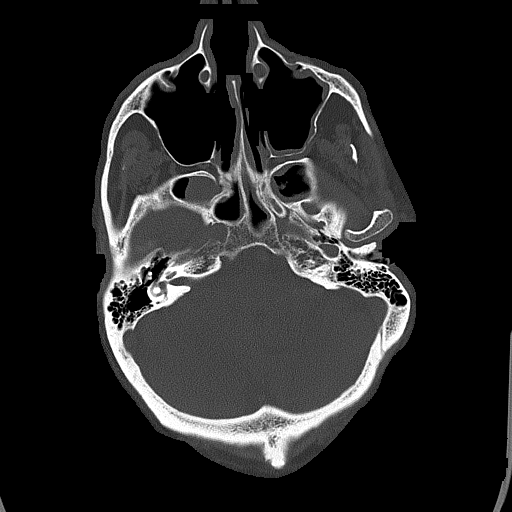
[im 26/85  bone]
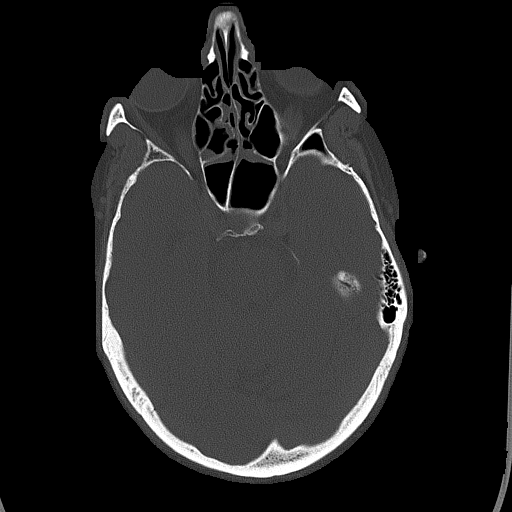

[Series 5: head 3.0 cor st · coronal · 0.31mm/px · 3 of 73 slices shown]
[im 25/73  brain]
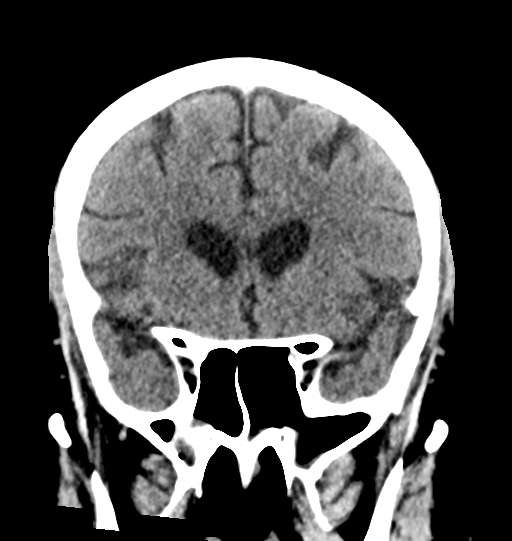
[im 33/73  brain]
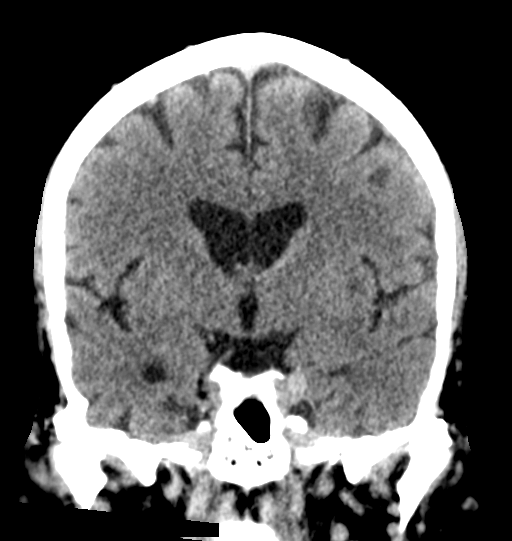
[im 41/73  brain]
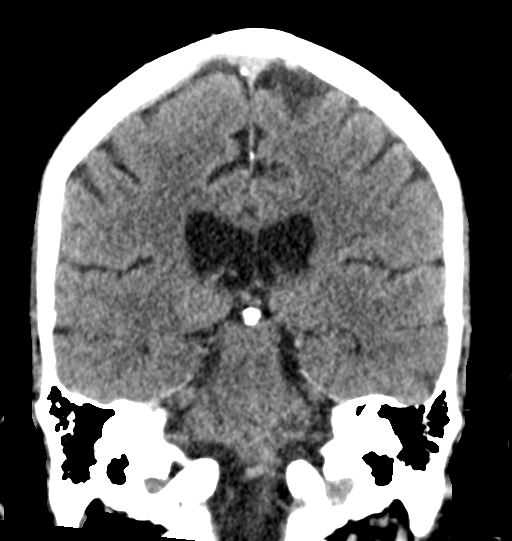

[Series 6: head 3.0 sag st · sagittal · 0.33mm/px · 3 of 54 slices shown]
[im 18/54  brain]
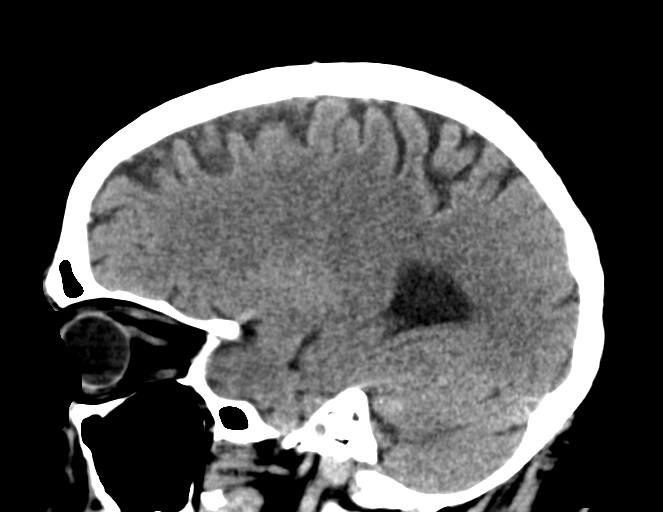
[im 27/54  brain]
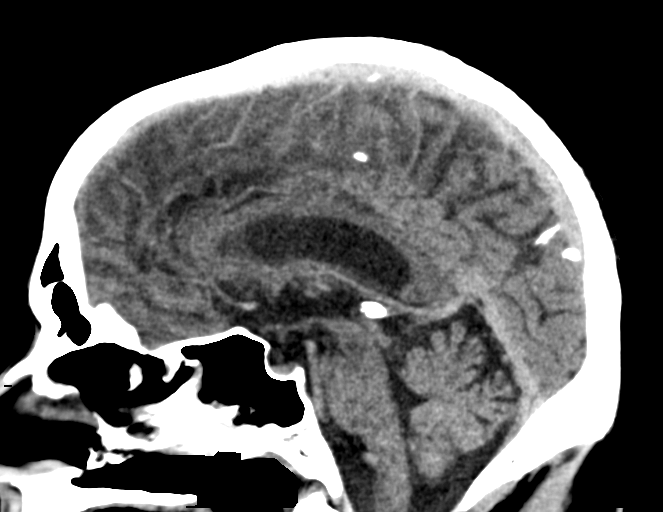
[im 36/54  brain]
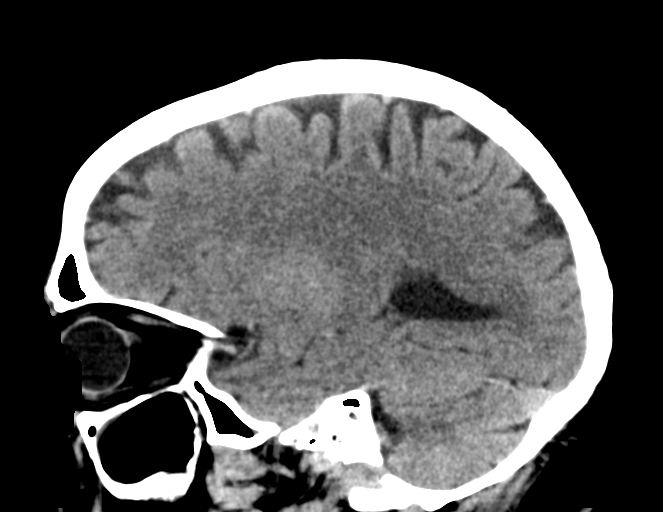

[16 of 47 positions shown; findings below may reference images not displayed]

FINDINGS: Brain: There is no evidence of acute intracranial hemorrhage,
extra-axial fluid collection, or acute infarct.

Background parenchymal volume is normal. The ventricles are normal
in size. Gray-white differentiation is preserved. There is no mass
lesion. There is no mass effect or midline shift.

Vascular: No hyperdense vessel or unexpected calcification.

Skull: Normal. Negative for fracture or focal lesion.

Sinuses/Orbits: There is soap bubble appearing fluid in the left
sphenoid sinus and mild mucosal thickening throughout the remainder
of the paranasal sinuses. Bilateral lens implants are in place. The
globes and orbits are otherwise unremarkable.

Other: None.

ASPECTS (Alberta Stroke Program Early CT Score)

- Ganglionic level infarction (caudate, lentiform nuclei, internal
capsule, insula, M1-M3 cortex): 7

- Supraganglionic infarction (M4-M6 cortex): 3

Total score (0-10 with 10 being normal): 10
IMPRESSION: 1. No acute intracranial hemorrhage or infarct.
2. ASPECTS is 10

These results were paged via AMION at the time of interpretation on
05/02/2021 at [DATE] to provider Dr Kimani.

## 2023-09-16 DIAGNOSIS — D2261 Melanocytic nevi of right upper limb, including shoulder: Secondary | ICD-10-CM | POA: Diagnosis not present

## 2023-09-16 DIAGNOSIS — D2262 Melanocytic nevi of left upper limb, including shoulder: Secondary | ICD-10-CM | POA: Diagnosis not present

## 2023-09-16 DIAGNOSIS — D225 Melanocytic nevi of trunk: Secondary | ICD-10-CM | POA: Diagnosis not present

## 2023-09-16 DIAGNOSIS — Z85828 Personal history of other malignant neoplasm of skin: Secondary | ICD-10-CM | POA: Diagnosis not present

## 2023-09-16 DIAGNOSIS — Z08 Encounter for follow-up examination after completed treatment for malignant neoplasm: Secondary | ICD-10-CM | POA: Diagnosis not present

## 2023-09-16 DIAGNOSIS — L821 Other seborrheic keratosis: Secondary | ICD-10-CM | POA: Diagnosis not present

## 2023-09-16 DIAGNOSIS — L820 Inflamed seborrheic keratosis: Secondary | ICD-10-CM | POA: Diagnosis not present

## 2023-09-16 DIAGNOSIS — L570 Actinic keratosis: Secondary | ICD-10-CM | POA: Diagnosis not present

## 2023-09-16 DIAGNOSIS — D485 Neoplasm of uncertain behavior of skin: Secondary | ICD-10-CM | POA: Diagnosis not present

## 2023-09-29 DIAGNOSIS — L570 Actinic keratosis: Secondary | ICD-10-CM | POA: Diagnosis not present

## 2023-10-21 ENCOUNTER — Telehealth: Payer: Self-pay | Admitting: *Deleted

## 2023-10-21 NOTE — Telephone Encounter (Signed)
 Copied from CRM [id number]. Topic: Clinical - Request for Lab/Test Order >> Oct 21, 2023 11:22 AM Rea ORN wrote: Reason for CRM: pt would like to order labs prior to CPE in November. Pt wanted to know if insurance will cover this or if he needs to wait until his appt.

## 2023-10-21 NOTE — Telephone Encounter (Signed)
 He should wait until the appt

## 2023-10-22 NOTE — Telephone Encounter (Signed)
 Spoke with pt advised of Dr Johnny response

## 2023-10-30 ENCOUNTER — Other Ambulatory Visit: Payer: Self-pay | Admitting: Family Medicine

## 2023-11-04 ENCOUNTER — Ambulatory Visit (INDEPENDENT_AMBULATORY_CARE_PROVIDER_SITE_OTHER)

## 2023-11-04 VITALS — Ht 74.0 in | Wt 185.0 lb

## 2023-11-04 DIAGNOSIS — Z0000 Encounter for general adult medical examination without abnormal findings: Secondary | ICD-10-CM | POA: Diagnosis not present

## 2023-11-04 NOTE — Progress Notes (Signed)
 Subjective:   Sean Mcgee is a 78 y.o. who presents for a Medicare Wellness preventive visit.  As a reminder, Annual Wellness Visits don't include a physical exam, and some assessments may be limited, especially if this visit is performed virtually. We may recommend an in-person follow-up visit with your provider if needed.  Visit Complete: Virtual I connected with  Sean Mcgee on 11/04/23 by a audio enabled telemedicine application and verified that I am speaking with the correct person using two identifiers.  Patient Location: Home  Provider Location: Home Office  I discussed the limitations of evaluation and management by telemedicine. The patient expressed understanding and agreed to proceed.  Vital Signs: Because this visit was a virtual/telehealth visit, some criteria may be missing or patient reported. Any vitals not documented were not able to be obtained and vitals that have been documented are patient reported.  VideoDeclined- This patient declined Librarian, academic. Therefore the visit was completed with audio only.  Persons Participating in Visit: Patient.  AWV Questionnaire: No: Patient Medicare AWV questionnaire was not completed prior to this visit.  Cardiac Risk Factors include: advanced age (>31men, >62 women);hypertension;male gender;dyslipidemia     Objective:    There were no vitals filed for this visit. There is no height or weight on file to calculate BMI.     11/04/2023    3:50 PM 05/03/2022    3:47 PM 10/08/2021    3:18 PM 05/01/2021   11:11 AM 01/03/2021   10:23 AM 09/18/2020    8:22 AM 05/01/2016   10:54 AM  Advanced Directives  Does Patient Have a Medical Advance Directive? No Yes No No No No No   Type of Furniture conservator/restorer;Living will       Would patient like information on creating a medical advance directive?   No - Patient declined No - Patient declined No - Patient declined No -  Patient declined      Data saved with a previous flowsheet row definition    Current Medications (verified) Outpatient Encounter Medications as of 11/04/2023  Medication Sig   amLODipine  (NORVASC ) 5 MG tablet TAKE ONE TABLET (5 MG TOTAL) BY MOUTH DAILY.   atorvastatin  (LIPITOR) 40 MG tablet TAKE ONE TABLET BY MOUTH DAILY   benazepril  (LOTENSIN ) 40 MG tablet TAKE ONE TABLET BY MOUTH ONCE A DAY   ELIQUIS  5 MG TABS tablet TAKE ONE TABLET BY MOUTH TWO TIMES DAILY   levothyroxine  (SYNTHROID ) 150 MCG tablet TAKE ONE TABLET BY MOUTH ONCE A DAY BEFORE BREAKFAST   metoprolol  succinate (TOPROL -XL) 50 MG 24 hr tablet TAKE ONE TABLET BY MOUTH ONCE DAILY TAKE WITH OR IMMEDIATELY FOLLOWING A MEAL   omeprazole  (PRILOSEC) 20 MG capsule Take 1 capsule (20 mg total) by mouth every other day.   vitamin B-12 (CYANOCOBALAMIN ) 500 MCG tablet Take 500 mcg by mouth daily.   No facility-administered encounter medications on file as of 11/04/2023.    Allergies (verified) Patient has no known allergies.   History: Past Medical History:  Diagnosis Date   ED (erectile dysfunction)    GERD (gastroesophageal reflux disease)    Hyperlipemia    Hypertension    Hypothyroid    Low testosterone    PAF (paroxysmal atrial fibrillation) (HCC)    a. 04/2021 Dx @ time of embolic CVA-->CHA2DS2VASc = 5-->Eliquis .   PFO (patent foramen ovale)    a. 04/2021 TEE: EF 65-70%, + PFO. 1+ AI/TR/MR/PI.   Skin cancer  basel cell removed in office    Stroke [facility])    a. 04/2021 s/p lytic rx-->recanalization by angio Jay); b. 04/2021 MRI small burden of acute-subacute L parietal lobe infarct; c. PAF found during hosp-->eliquis .   Past Surgical History:  Procedure Laterality Date   COLONOSCOPY  02/23/2020   per Dr. Leigh, adenomatous and sessile serrated polyps, repeat in 5 years   HERNIA REPAIR     inguinal  bilateral   POLYPECTOMY     VASECTOMY     Family History  Problem Relation Age of Onset   Coronary artery  disease Other    Cancer Other        abdominal   Stomach cancer Father    Colon cancer Neg Hx    Esophageal cancer Neg Hx    Rectal cancer Neg Hx    Colon polyps Neg Hx    Social History   Socioeconomic History   Marital status: Married    Spouse name: Beverley   Number of children: 5   Years of education: Not on file   Highest education level: Not on file  Occupational History   Occupation: RETIRED  Tobacco Use   Smoking status: Never   Smokeless tobacco: Never  Vaping Use   Vaping status: Never Used  Substance and Sexual Activity   Alcohol use: Yes    Alcohol/week: 6.0 standard drinks of alcohol    Types: 6 Glasses of wine per week    Comment: glass of wine   Drug use: No   Sexual activity: Not on file  Other Topics Concern   Not on file  Social History Narrative   Lives with wife/2025   Social Drivers of Health   Financial Resource Strain: Low Risk  (10/08/2021)   Overall Financial Resource Strain (CARDIA)    Difficulty of Paying Living Expenses: Not hard at all  Food Insecurity: No Food Insecurity (10/08/2021)   Hunger Vital Sign    Worried About Running Out of Food in the Last Year: Never true    Ran Out of Food in the Last Year: Never true  Transportation Needs: No Transportation Needs (11/04/2023)   PRAPARE - Administrator, Civil Service (Medical): No    Lack of Transportation (Non-Medical): No  Physical Activity: Inactive (11/04/2023)   Exercise Vital Sign    Days of Exercise per Week: 0 days    Minutes of Exercise per Session: 0 min  Stress: No Stress Concern Present (11/04/2023)   [employer] of Occupational Health - Occupational Stress Questionnaire    Feeling of Stress: Not at all  Social Connections: Socially Integrated (11/04/2023)   Social Connection and Isolation Panel    Frequency of Communication with Friends and Family: More than three times a week    Frequency of Social Gatherings with Friends and Family: Once a week    Attends  Religious Services: More than 4 times per year    Active Member of [employer] or Organizations: Yes    Attends Engineer, structural: More than 4 times per year    Marital Status: Married    Tobacco Counseling Counseling given: Not Answered    Clinical Intake:  Pre-visit preparation completed: Yes  Pain : No/denies pain        Lab Results  Component Value Date   HGBA1C 5.1 12/20/2022   HGBA1C 5.3 12/18/2021   HGBA1C 5.0 12/04/2020     How often do you need to have someone help you when you read  instructions, pamphlets, or other written materials from your doctor or pharmacy?: 1 - Never  Interpreter Needed?: No      Activities of Daily Living     11/04/2023    3:48 PM  In your present state of health, do you have any difficulty performing the following activities:  Hearing? 0  Vision? 0  Difficulty concentrating or making decisions? 0  Walking or climbing stairs? 0  Dressing or bathing? 0  Doing errands, shopping? 0  Preparing Food and eating ? N  Using the Toilet? N  In the past six months, have you accidently leaked urine? N  Do you have problems with loss of bowel control? N  Managing your Medications? N  Managing your Finances? N  Housekeeping or managing your Housekeeping? N    Patient Care Team: Johnny Garnette LABOR, MD as PCP - General Gollan, Evalene PARAS, MD as PCP - Cardiology (Cardiology) Melanee Annah BROCKS, MD as Consulting Physician (Oncology)  I have updated your Care Teams any recent Medical Services you may have received from other providers in the past year.     Assessment:   This is a routine wellness examination for Sean Mcgee.  Hearing/Vision screen Hearing Screening - Comments:: Denies hearing difficulties   Vision Screening - Comments:: Had cataract surgery/Dr. Mevelyn   Goals Addressed               This Visit's Progress     Patient stated (pt-stated)   On track     To say no and do less. Stay healthy       Depression Screen      11/04/2023    3:53 PM 12/20/2022   10:25 AM 10/08/2021    3:13 PM 06/12/2021    9:53 AM 05/22/2021    3:11 PM 05/11/2021    2:58 PM 12/04/2020   10:22 AM  PHQ 2/9 Scores  PHQ - 2 Score 0 0 0 0 0 0 0  PHQ- 9 Score 0 0  0 0 1 0    Fall Risk     11/04/2023    3:51 PM 12/20/2022   10:25 AM 10/08/2021    3:16 PM 06/12/2021    9:53 AM 05/22/2021    3:11 PM  Fall Risk   Falls in the past year? 0 0 0 0 1  Number falls in past yr: 0 0 0 0 0  Injury with Fall? 0 0 0 0 1  Risk for fall due to :  No Fall Risks No Fall Risks No Fall Risks No Fall Risks  Follow up Falls evaluation completed;Falls prevention discussed Falls evaluation completed Falls prevention discussed  Falls evaluation completed  Falls evaluation completed      Data saved with a previous flowsheet row definition    MEDICARE RISK AT HOME:  Medicare Risk at Home Any stairs in or around the home?: Yes (outside in the front and back of house) If so, are there any without handrails?: No Home free of loose throw rugs in walkways, pet beds, electrical cords, etc?: Yes Adequate lighting in your home to reduce risk of falls?: Yes Life alert?: No Use of a cane, walker or w/c?: No Grab bars in the bathroom?: Yes Shower chair or bench in shower?: No Elevated toilet seat or a handicapped toilet?: No  TIMED UP AND GO:  Was the test performed?  No  Cognitive Function: Declined/Normal: No cognitive concerns noted by patient or family. Patient alert, oriented, able to answer questions appropriately and  recall recent events. No signs of memory loss or confusion.        10/08/2021    3:18 PM  6CIT Screen  What Year? 0 points  What month? 0 points  What time? 0 points  Count back from 20 0 points  Months in reverse 0 points  Repeat phrase 0 points  Total Score 0 points    Immunizations Immunization History  Administered Date(s) Administered   Fluad Quad(high Dose 65+) 11/23/2018, 11/24/2019, 12/04/2020, 12/18/2021   Fluad  Trivalent(High Dose 65+) 12/20/2022   PNEUMOCOCCAL CONJUGATE-20 12/20/2022   Pneumococcal Conjugate-13 12/04/2020    Screening Tests Health Maintenance  Topic Date Due   DTaP/Tdap/Td (1 - Tdap) Never done   Zoster Vaccines- Shingrix (1 of 2) Never done   Medicare Annual Wellness (AWV)  10/09/2022   Influenza Vaccine  09/12/2023   COVID-19 Vaccine (1 - 2024-25 season) Never done   Colonoscopy  02/22/2025   Pneumococcal Vaccine: 50+ Years  Completed   Hepatitis C Screening  Completed   HPV VACCINES  Aged Out   Meningococcal B Vaccine  Aged Out    Health Maintenance Items Addressed: See Nurse Notes at the end of this note  Additional Screening:  Vision Screening: Recommended annual ophthalmology exams for early detection of glaucoma and other disorders of the eye. Is the patient up to date with their annual eye exam?  No  Who is the provider or what is the name of the office in which the patient attends annual eye exams? Dr. Buckley  Dental Screening: Recommended annual dental exams for proper oral hygiene  Community Resource Referral / Chronic Care Management: CRR required this visit?  No   CCM required this visit?  No   Plan:    I have personally reviewed and noted the following in the patient's chart:   Medical and social history Use of alcohol, tobacco or illicit drugs  Current medications and supplements including opioid prescriptions. Patient is not currently taking opioid prescriptions. Functional ability and status Nutritional status Physical activity Advanced directives List of other physicians Hospitalizations, surgeries, and ER visits in previous 12 months Vitals Screenings to include cognitive, depression, and falls Referrals and appointments  In addition, I have reviewed and discussed with patient certain preventive protocols, quality metrics, and best practice recommendations. A written personalized care plan for preventive services as well as  general preventive health recommendations were provided to patient.   Berniece Abid L Vail Vuncannon, CMA   11/04/2023   After Visit Summary: (MyChart) Due to this being a telephonic visit, the after visit summary with patients personalized plan was offered to patient via MyChart   Notes: Patient is due for a Flu, Tdap and a Shingrix vaccine.  Patient is up to date on all other health maintenance with no concerns to address today.

## 2023-11-04 NOTE — Patient Instructions (Signed)
 [name],  Thank you for taking the time for your Medicare Wellness Visit. I appreciate your continued commitment to your health goals. Please review the care plan we discussed, and feel free to reach out if I can assist you further.  Medicare recommends these wellness visits once per year to help you and your care team stay ahead of potential health issues. These visits are designed to focus on prevention, allowing your provider to concentrate on managing your acute and chronic conditions during your regular appointments.  Please note that Annual Wellness Visits do not include a physical exam. Some assessments may be limited, especially if the visit was conducted virtually. If needed, we may recommend a separate in-person follow-up with your provider.  Ongoing Care Seeing your primary care provider every 3 to 6 months helps us  monitor your health and provide consistent, personalized care. Next office visit on 12/22/2023.  You are due for a Flu vaccine and a tetanus vaccine and a shingles vaccine.  Keep moving.  Referrals If a referral was made during today's visit and you haven't received any updates within two weeks, please contact the referred provider directly to check on the status.  Recommended Screenings:  Health Maintenance  Topic Date Due   DTaP/Tdap/Td vaccine (1 - Tdap) Never done   Zoster (Shingles) Vaccine (1 of 2) Never done   Medicare Annual Wellness Visit  10/09/2022   Flu Shot  09/12/2023   COVID-19 Vaccine (1 - 2024-25 season) Never done   Colon Cancer Screening  02/22/2025   Pneumococcal Vaccine for age over 45  Completed   Hepatitis C Screening  Completed   HPV Vaccine  Aged Out   Meningitis B Vaccine  Aged Out       11/04/2023    3:50 PM  Advanced Directives  Does Patient Have a Medical Advance Directive? No   Advance Care Planning is important because it: Ensures you receive medical care that aligns with your values, goals, and preferences. Provides guidance  to your family and loved ones, reducing the emotional burden of decision-making during critical moments.  Vision: Annual vision screenings are recommended for early detection of glaucoma, cataracts, and diabetic retinopathy. These exams can also reveal signs of chronic conditions such as diabetes and high blood pressure.  Dental: Annual dental screenings help detect early signs of oral cancer, gum disease, and other conditions linked to overall health, including heart disease and diabetes.  Please see the attached documents for additional preventive care recommendations.

## 2023-11-17 ENCOUNTER — Other Ambulatory Visit: Payer: Self-pay | Admitting: Family Medicine

## 2023-11-19 NOTE — Progress Notes (Addendum)
 Sean Mcgee                                          MRN: [id number]   11/19/2023   The [facility]BCI Quality Team Specialist reviewed this patient medical record for the purposes of chart review for care gap closure. The following were reviewed: chart review for care gap closure-controlling blood pressure.  01/22/2024- Abstracted CBP  02/25/2024- NO NEW CBP, PREVIOUSLY SUBMITTED   VBCI Quality Team

## 2023-11-20 ENCOUNTER — Other Ambulatory Visit: Payer: Self-pay | Admitting: Family Medicine

## 2023-12-04 ENCOUNTER — Other Ambulatory Visit: Payer: Self-pay | Admitting: Family Medicine

## 2023-12-10 NOTE — Progress Notes (Unsigned)
 Cardiology Office Note    Date:  12/11/2023   ID:  Sean Mcgee, Sean Mcgee [birth year], MRN [id number]  PCP:  Johnny Garnette LABOR, MD  Cardiologist:  Evalene Lunger, MD  Electrophysiologist:  None   Chief Complaint: Follow up  History of Present Illness:   Sean Mcgee is a 78 y.o. 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    Patient was admitted to [facility] 04/2021 with acute stroke.  Imaging revealed left M2 occlusion and he was placed on tenecteplase .  He was transferred to [facility] for interventional radiology evaluation.  Cerebral angiogram showed no large vessel occlusion with recannulization of the vessel.  Subsequent MRI of the head without contrast showed small burden of acute to subacute infarct within the left parietal lobe.  TEE during admission showed EF 65 to 70% without LA/LAA thrombus.  PFO was present by a bubble study.  Mild AI, TR, MR, and PI were noted.  He was found to have paroxysmal atrial fibrillation during hospitalization and was started on Eliquis  at discharge.  Patient establish care with Dr. Gollan 06/2021.  He was hypertensive at that time and placed on amlodipine  therapy.  He was seen in follow-up in our office 12/2021 overall doing well from a cardiac perspective.  He occasionally noted brief spells of skipped beats or palpitations typically lasting 1 to 3 minutes.  He believed these episodes to be him going into atrial fibrillation but aside from irregular heartbeat, he was not bothered by it.  He was compliant with his medications and taking blood pressure regularly at home.   Patient was most recently seen in our office 07/15/2022 overall doing well.  BP was elevated in office but well-controlled per at home readings.  Reported heart rate had been running low in the 50s but he was  asymptomatic to this.  No further testing or medication changes were made at that time.  Patient presents today overall doing well from a cardiac perspective.  He reports occasional feelings of his heart skipping a beat which typically last no longer than 1 to 2 minutes.  No symptoms of palpitations concerning for recurrence of atrial fibrillation.  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  He does not do any structured exercise.  He denies chest pain, shortness of breath, lightheadedness, dizziness, and lower extremity swelling.  Labs independently reviewed: 12/2022-TC 117, TG 81, HDL 48, LDL 52, normal LFTs, sodium 139, potassium 4.4, BUN 13, creatinine 1.11, hemoglobin 16.0, hematocrit 47.3, platelets 129, A1c 5.1  Objective   Past Medical History:  Diagnosis Date   ED (erectile dysfunction)    GERD (gastroesophageal reflux disease)    Hyperlipemia    Hypertension    Hypothyroid    Low testosterone    PAF (paroxysmal atrial fibrillation) (HCC)    a. 04/2021 Dx @ time of embolic CVA-->CHA2DS2VASc = 5-->Eliquis .   PFO (patent foramen ovale)    a. 04/2021 TEE: EF 65-70%, + PFO. 1+ AI/TR/MR/PI.   Skin cancer    basel cell removed in office    Stroke [facility])    a. 04/2021 s/p lytic rx-->recanalization by angio Jay); b. 04/2021 MRI small burden of acute-subacute L parietal lobe infarct; c. PAF found during hosp-->eliquis .    Current Medications: Current Meds  Medication Sig   amLODipine  (NORVASC ) 5 MG tablet TAKE ONE TABLET (5 MG  TOTAL) BY MOUTH DAILY.   atorvastatin  (LIPITOR) 40 MG tablet TAKE ONE TABLET BY MOUTH DAILY   benazepril  (LOTENSIN ) 40 MG tablet TAKE ONE TABLET BY MOUTH ONCE A DAY   ELIQUIS  5 MG TABS tablet TAKE ONE TABLET BY MOUTH TWO TIMES DAILY   levothyroxine  (SYNTHROID ) 150 MCG tablet TAKE ONE TABLET BY MOUTH ONCE A DAY BEFORE BREAKFAST   metoprolol  succinate (TOPROL -XL) 50 MG 24 hr tablet TAKE ONE TABLET BY MOUTH ONCE DAILY TAKE WITH OR  IMMEDIATELY FOLLOWING A MEAL   omeprazole  (PRILOSEC) 20 MG capsule Take 1 capsule (20 mg total) by mouth every other day.   vitamin B-12 (CYANOCOBALAMIN ) 500 MCG tablet Take 500 mcg by mouth daily.    Allergies:   Patient has no known allergies.   Social History   Socioeconomic History   Marital status: Married    Spouse name: Beverley   Number of children: 5   Years of education: Not on file   Highest education level: Not on file  Occupational History   Occupation: RETIRED  Tobacco Use   Smoking status: Never   Smokeless tobacco: Never  Vaping Use   Vaping status: Never Used  Substance and Sexual Activity   Alcohol use: Yes    Alcohol/week: 6.0 standard drinks of alcohol    Types: 6 Glasses of wine per week    Comment: glass of wine   Drug use: No   Sexual activity: Not on file  Other Topics Concern   Not on file  Social History Narrative   Lives with wife/2025   Social Drivers of Health   Financial Resource Strain: Low Risk  (10/08/2021)   Overall Financial Resource Strain (CARDIA)    Difficulty of Paying Living Expenses: Not hard at all  Food Insecurity: No Food Insecurity (10/08/2021)   Hunger Vital Sign    Worried About Running Out of Food in the Last Year: Never true    Ran Out of Food in the Last Year: Never true  Transportation Needs: No Transportation Needs (11/04/2023)   PRAPARE - Administrator, Civil Service (Medical): No    Lack of Transportation (Non-Medical): No  Physical Activity: Inactive (11/04/2023)   Exercise Vital Sign    Days of Exercise per Week: 0 days    Minutes of Exercise per Session: 0 min  Stress: No Stress Concern Present (11/04/2023)   [employer] of Occupational Health - Occupational Stress Questionnaire    Feeling of Stress: Not at all  Social Connections: Socially Integrated (11/04/2023)   Social Connection and Isolation Panel    Frequency of Communication with Friends and Family: More than three times a week     Frequency of Social Gatherings with Friends and Family: Once a week    Attends Religious Services: More than 4 times per year    Active Member of [employer] or Organizations: Yes    Attends Engineer, Structural: More than 4 times per year    Marital Status: Married     Family History:  The patient's family history includes Cancer in an other family member; Coronary artery disease in an other family member; Stomach cancer in his father. There is no history of Colon cancer, Esophageal cancer, Rectal cancer, or Colon polyps.  ROS:   12-point review of systems is negative unless otherwise noted in the HPI.  EKGs/Other Studies Reviewed:    EKG:  EKG personally reviewed by me today EKG Interpretation Date/Time:  Thursday December 11 2023 08:52:42 EDT Ventricular  Rate:  54 PR Interval:  202 QRS Duration:  102 QT Interval:  438 QTC Calculation: 415 R Axis:   55  Text Interpretation: Sinus bradycardia When compared with ECG of 02-May-2021 09:30, No significant change was found Confirmed by Lorene Sinclair ([id number]) on 12/11/2023 8:56:19 AM  PHYSICAL EXAM:    VS:  BP 130/70 (BP Location: Left Arm, Patient Position: Sitting, Cuff Size: Normal)   Pulse (!) 54   Ht 6' 2 (1.88 m)   Wt 183 lb 6 oz (83.2 kg)   SpO2 94%   BMI 23.54 kg/m   BMI: Body mass index is 23.54 kg/m.  GEN: Well nourished, well developed in no acute distress NECK: No JVD; No carotid bruits CARDIAC: RRR, no murmurs, rubs, gallops RESPIRATORY:  Clear to auscultation without rales, wheezing or rhonchi  ABDOMEN: Soft, non-tender, non-distended EXTREMITIES: No edema; No deformity  Wt Readings from Last 3 Encounters:  12/11/23 183 lb 6 oz (83.2 kg)  11/04/23 185 lb (83.9 kg)  12/20/22 185 lb (83.9 kg)                  ASSESSMENT & PLAN:   Paroxysmal atrial fibrillation - No symptoms concerning for recurrence of atrial fibrillation.  EKG today shows sinus rhythm.  He is continued on Eliquis  5 mg twice daily  and metoprolol  succinate 50 mg daily.  Denies bleeding and hematochezia.  He will have updated labs with his PCP next month.  Essential hypertension - Blood pressure mildly elevated in office today.  He brings blood pressure log which shows home readings are well-controlled with systolic pressures in the 110s to 120s mmHg. he is continued on metoprolol  succinate 50 mg daily and amlodipine  5 mg daily.  Hyperlipidemia - Most recent lipid panel LDL 12/2022 with LDL 52, at goal.  He is continued on atorvastatin  40 mg daily.  Hx stroke - He is continued on statin and DOAC as above.     Disposition: F/u with Dr. Gollan or an APP in 1 year.   Medication Adjustments/Labs and Tests Ordered: Current medicines are reviewed at length with the patient today.  Concerns regarding medicines are outlined above. Medication changes, Labs and Tests ordered today are summarized above and listed in the Patient Instructions accessible in Encounters.   Signed, Sinclair Lorene, PA-C 12/11/2023 9:25 AM     [facility] [address] [place], [place] [address] [phone]

## 2023-12-11 ENCOUNTER — Ambulatory Visit: Attending: Nurse Practitioner | Admitting: Physician Assistant

## 2023-12-11 ENCOUNTER — Encounter: Payer: Self-pay | Admitting: Nurse Practitioner

## 2023-12-11 VITALS — BP 130/70 | HR 54 | Ht 74.0 in | Wt 183.4 lb

## 2023-12-11 DIAGNOSIS — I63412 Cerebral infarction due to embolism of left middle cerebral artery: Secondary | ICD-10-CM

## 2023-12-11 DIAGNOSIS — I480 Paroxysmal atrial fibrillation: Secondary | ICD-10-CM | POA: Diagnosis not present

## 2023-12-11 DIAGNOSIS — I10 Essential (primary) hypertension: Secondary | ICD-10-CM | POA: Diagnosis not present

## 2023-12-11 DIAGNOSIS — E785 Hyperlipidemia, unspecified: Secondary | ICD-10-CM | POA: Diagnosis not present

## 2023-12-11 NOTE — Patient Instructions (Signed)
 Medication Instructions:   Your physician recommends that you continue on your current medications as directed. Please refer to the Current Medication list given to you today.   *If you need a refill on your cardiac medications before your next appointment, please call your pharmacy*  Lab Work: No labs ordered today  If you have labs (blood work) drawn today and your tests are completely normal, you will receive your results only by: MyChart Message (if you have MyChart) OR A paper copy in the mail If you have any lab test that is abnormal or we need to change your treatment, we will call you to review the results.  Testing/Procedures: No test ordered today   Follow-Up: At [facility], you and your health needs are our priority.  As part of our continuing mission to provide you with exceptional heart care, our providers are all part of one team.  This team includes your primary Cardiologist (physician) and Advanced Practice Providers or APPs (Physician Assistants and Nurse Practitioners) who all work together to provide you with the care you need, when you need it.  Your next appointment:   1 year(s)  Provider:   You may see Timothy Gollan, MD  or one of the following Advanced Practice Providers on your designated Care Team:   Laneta Pintos, NP Gildardo Labrador, PA-C Varney Gentleman, PA-C Cadence Alta Sierra, PA-C Ronald Cockayne, NP Morey Ar, NP    We recommend signing up for the patient portal called MyChart.  Sign up information is provided on this After Visit Summary.  MyChart is used to connect with patients for Virtual Visits (Telemedicine).  Patients are able to view lab/test results, encounter notes, upcoming appointments, etc.  Non-urgent messages can be sent to your provider as well.   To learn more about what you can do with MyChart, go to [email or web address].

## 2023-12-22 ENCOUNTER — Ambulatory Visit: Admitting: Family Medicine

## 2023-12-22 ENCOUNTER — Encounter: Payer: Self-pay | Admitting: Family Medicine

## 2023-12-22 VITALS — BP 138/70 | HR 58 | Temp 98.2°F | Ht 74.0 in | Wt 183.2 lb

## 2023-12-22 DIAGNOSIS — K219 Gastro-esophageal reflux disease without esophagitis: Secondary | ICD-10-CM

## 2023-12-22 DIAGNOSIS — R739 Hyperglycemia, unspecified: Secondary | ICD-10-CM | POA: Diagnosis not present

## 2023-12-22 DIAGNOSIS — E039 Hypothyroidism, unspecified: Secondary | ICD-10-CM

## 2023-12-22 DIAGNOSIS — K429 Umbilical hernia without obstruction or gangrene: Secondary | ICD-10-CM | POA: Diagnosis not present

## 2023-12-22 DIAGNOSIS — N401 Enlarged prostate with lower urinary tract symptoms: Secondary | ICD-10-CM | POA: Diagnosis not present

## 2023-12-22 DIAGNOSIS — E538 Deficiency of other specified B group vitamins: Secondary | ICD-10-CM | POA: Diagnosis not present

## 2023-12-22 DIAGNOSIS — N138 Other obstructive and reflux uropathy: Secondary | ICD-10-CM

## 2023-12-22 DIAGNOSIS — I10 Essential (primary) hypertension: Secondary | ICD-10-CM | POA: Diagnosis not present

## 2023-12-22 DIAGNOSIS — E782 Mixed hyperlipidemia: Secondary | ICD-10-CM

## 2023-12-22 DIAGNOSIS — I480 Paroxysmal atrial fibrillation: Secondary | ICD-10-CM | POA: Diagnosis not present

## 2023-12-22 LAB — BASIC METABOLIC PANEL WITH GFR
BUN: 17 mg/dL (ref 6–23)
CO2: 27 meq/L (ref 19–32)
Calcium: 9 mg/dL (ref 8.4–10.5)
Chloride: 104 meq/L (ref 96–112)
Creatinine, Ser: 0.98 mg/dL (ref 0.40–1.50)
GFR: 74.35 mL/min (ref >60.00)
Glucose, Bld: 87 mg/dL (ref 70–99)
Potassium: 4.3 meq/L (ref 3.5–5.1)
Sodium: 138 meq/L (ref 135–145)

## 2023-12-22 LAB — LIPID PANEL
Cholesterol: 102 mg/dL (ref 0–200)
HDL: 46.5 mg/dL (ref >39.00)
LDL Cholesterol: 46 mg/dL (ref 0–99)
NonHDL: 55.96
Total CHOL/HDL Ratio: 2
Triglycerides: 49 mg/dL (ref 0.0–149.0)
VLDL: 9.8 mg/dL (ref 0.0–40.0)

## 2023-12-22 LAB — CBC WITH DIFFERENTIAL/PLATELET
Basophils Absolute: 0 K/uL (ref 0.0–0.1)
Basophils Relative: 0.6 % (ref 0.0–3.0)
Eosinophils Absolute: 0.1 K/uL (ref 0.0–0.7)
Eosinophils Relative: 2.8 % (ref 0.0–5.0)
HCT: 45.9 % (ref 39.0–52.0)
Hemoglobin: 15.5 g/dL (ref 13.0–17.0)
Lymphocytes Relative: 21.8 % (ref 12.0–46.0)
Lymphs Abs: 1.1 K/uL (ref 0.7–4.0)
MCHC: 33.8 g/dL (ref 30.0–36.0)
MCV: 89 fl (ref 78.0–100.0)
Monocytes Absolute: 0.4 K/uL (ref 0.1–1.0)
Monocytes Relative: 8.2 % (ref 3.0–12.0)
Neutro Abs: 3.4 K/uL (ref 1.4–7.7)
Neutrophils Relative %: 66.6 % (ref 43.0–77.0)
Platelets: 119 K/uL — ABNORMAL LOW (ref 150.0–400.0)
RBC: 5.16 Mil/uL (ref 4.22–5.81)
RDW: 14.7 % (ref 11.5–15.5)
WBC: 5.1 K/uL (ref 4.0–10.5)

## 2023-12-22 LAB — HEPATIC FUNCTION PANEL
ALT: 16 U/L (ref 0–53)
AST: 13 U/L (ref 0–37)
Albumin: 4.3 g/dL (ref 3.5–5.2)
Alkaline Phosphatase: 71 U/L (ref 39–117)
Bilirubin, Direct: 0.2 mg/dL (ref 0.0–0.3)
Total Bilirubin: 1 mg/dL (ref 0.2–1.2)
Total Protein: 6.7 g/dL (ref 6.0–8.3)

## 2023-12-22 LAB — T4, FREE: Free T4: 1.1 ng/dL (ref 0.60–1.60)

## 2023-12-22 LAB — HEMOGLOBIN A1C: Hgb A1c MFr Bld: 5.1 % (ref 4.6–6.5)

## 2023-12-22 LAB — VITAMIN B12: Vitamin B-12: 486 pg/mL (ref 211–911)

## 2023-12-22 LAB — PSA: PSA: 0.68 ng/mL (ref 0.10–4.00)

## 2023-12-22 LAB — TSH: TSH: 0.62 u[IU]/mL (ref 0.35–5.50)

## 2023-12-22 LAB — T3, FREE: T3, Free: 2.9 pg/mL (ref 2.3–4.2)

## 2023-12-22 MED ORDER — OMEPRAZOLE 20 MG PO CPDR: 20.0000 mg | DELAYED_RELEASE_CAPSULE | ORAL | Status: AC

## 2023-12-22 NOTE — Progress Notes (Signed)
 Subjective:    Patient ID: Sean Mcgee, male    DOB: 1946/01/07, 78 y.o.   MRN: [id number]  HPI Here to follow up on issues. He feels well. His BP has been stable at home. He saw Cardiology a few weeks ago, and his PAF is stable. He has been in sinus rhythm. His GERD is stable. His umbilical hernia never bothers him.    Review of Systems  Constitutional: Negative.   HENT: Negative.    Eyes: Negative.   Respiratory: Negative.    Cardiovascular: Negative.   Gastrointestinal: Negative.   Genitourinary: Negative.   Musculoskeletal: Negative.   Skin: Negative.   Neurological: Negative.   Psychiatric/Behavioral: Negative.         Objective:   Physical Exam Constitutional:      General: He is not in acute distress.    Appearance: Normal appearance. He is well-developed. He is not diaphoretic.  HENT:     Head: Normocephalic and atraumatic.     Right Ear: External ear normal.     Left Ear: External ear normal.     Nose: Nose normal.     Mouth/Throat:     Pharynx: No oropharyngeal exudate.  Eyes:     General: No scleral icterus.       Right eye: No discharge.        Left eye: No discharge.     Conjunctiva/sclera: Conjunctivae normal.     Pupils: Pupils are equal, round, and reactive to light.  Neck:     Thyroid : No thyromegaly.     Vascular: No JVD.     Trachea: No tracheal deviation.  Cardiovascular:     Rate and Rhythm: Normal rate and regular rhythm.     Pulses: Normal pulses.     Heart sounds: Normal heart sounds. No murmur heard.    No friction rub. No gallop.  Pulmonary:     Effort: Pulmonary effort is normal. No respiratory distress.     Breath sounds: Normal breath sounds. No wheezing or rales.  Chest:     Chest wall: No tenderness.  Abdominal:     General: Bowel sounds are normal. There is no distension.     Palpations: Abdomen is soft. There is no mass.     Tenderness: There is no abdominal tenderness. There is no guarding or rebound.     Comments: He  has a small umbilical hernia that is not reducible but is not tender at all   Genitourinary:    Penis: Normal. No tenderness.      Testes: Normal.  Musculoskeletal:        General: No tenderness. Normal range of motion.     Cervical back: Neck supple.  Lymphadenopathy:     Cervical: No cervical adenopathy.  Skin:    General: Skin is warm and dry.     Coloration: Skin is not pale.     Findings: No erythema or rash.  Neurological:     General: No focal deficit present.     Mental Status: He is alert and oriented to person, place, and time.     Cranial Nerves: No cranial nerve deficit.     Motor: No abnormal muscle tone.     Coordination: Coordination normal.     Deep Tendon Reflexes: Reflexes are normal and symmetric. Reflexes normal.  Psychiatric:        Mood and Affect: Mood normal.        Behavior: Behavior normal.  Thought Content: Thought content normal.        Judgment: Judgment normal.           Assessment & Plan:  His atrial fibrillation and HTN are stable. His GERD is stable. His umbilical hernia is stable. We will get labs to check lipids, a thyroid  panel, B12 level, etc. I personally spent a total of 34  minutes in the care of the patient today including getting/reviewing separately obtained history, performing a medically appropriate exam/evaluation, documenting clinical information in the EHR, and coordinating care  Garnette Olmsted, MD .

## 2023-12-23 ENCOUNTER — Ambulatory Visit: Payer: Self-pay | Admitting: Family Medicine

## 2024-02-13 ENCOUNTER — Other Ambulatory Visit: Payer: Self-pay | Admitting: Family Medicine
